# Patient Record
Sex: Female | Born: 1965 | Race: Black or African American | Hispanic: No | Smoking: Never smoker
Health system: Southern US, Community
[De-identification: ages and names within clinical notes are randomized; demographics above are authoritative.]

## PROBLEM LIST (undated history)

## (undated) DIAGNOSIS — D649 Anemia, unspecified: Secondary | ICD-10-CM

## (undated) HISTORY — DX: Anemia, unspecified: D64.9

## (undated) HISTORY — PX: TUBAL LIGATION: SHX77

---

## 2004-10-05 ENCOUNTER — Emergency Department: Payer: Self-pay | Admitting: Emergency Medicine

## 2011-12-24 ENCOUNTER — Other Ambulatory Visit: Payer: Self-pay | Admitting: Internal Medicine

## 2011-12-24 DIAGNOSIS — Z131 Encounter for screening for diabetes mellitus: Secondary | ICD-10-CM

## 2011-12-24 DIAGNOSIS — Z13220 Encounter for screening for lipoid disorders: Secondary | ICD-10-CM

## 2011-12-24 DIAGNOSIS — Z789 Other specified health status: Secondary | ICD-10-CM

## 2011-12-25 ENCOUNTER — Ambulatory Visit (INDEPENDENT_AMBULATORY_CARE_PROVIDER_SITE_OTHER): Payer: Self-pay | Admitting: Internal Medicine

## 2011-12-25 ENCOUNTER — Other Ambulatory Visit (INDEPENDENT_AMBULATORY_CARE_PROVIDER_SITE_OTHER): Payer: Self-pay

## 2011-12-25 VITALS — BP 108/68 | HR 60 | Temp 98.6°F | Resp 12 | Ht 64.5 in | Wt 134.0 lb

## 2011-12-25 DIAGNOSIS — Z124 Encounter for screening for malignant neoplasm of cervix: Secondary | ICD-10-CM

## 2011-12-25 DIAGNOSIS — Z0000 Encounter for general adult medical examination without abnormal findings: Secondary | ICD-10-CM

## 2011-12-25 DIAGNOSIS — Z1239 Encounter for other screening for malignant neoplasm of breast: Secondary | ICD-10-CM

## 2011-12-25 DIAGNOSIS — Z13220 Encounter for screening for lipoid disorders: Secondary | ICD-10-CM

## 2011-12-25 NOTE — Addendum Note (Signed)
Addended by: Mauri Reading on: 12/25/2011 08:34 AM   Modules accepted: Orders

## 2011-12-26 ENCOUNTER — Encounter: Payer: Self-pay | Admitting: Internal Medicine

## 2011-12-26 DIAGNOSIS — Z124 Encounter for screening for malignant neoplasm of cervix: Secondary | ICD-10-CM | POA: Insufficient documentation

## 2011-12-26 DIAGNOSIS — Z1239 Encounter for other screening for malignant neoplasm of breast: Secondary | ICD-10-CM | POA: Insufficient documentation

## 2011-12-26 DIAGNOSIS — Z0000 Encounter for general adult medical examination without abnormal findings: Secondary | ICD-10-CM | POA: Insufficient documentation

## 2011-12-26 NOTE — Assessment & Plan Note (Signed)
She will return for annual Pap smear.

## 2011-12-26 NOTE — Assessment & Plan Note (Signed)
She will return for her annual breast exam and mammogram order

## 2011-12-26 NOTE — Assessment & Plan Note (Signed)
Fasting lipids liver and kidney function and screen for nicotine abuse were all done today. Lipids and liver and kidney function were all normal. The nicotine metabolite  test still pending.

## 2011-12-26 NOTE — Progress Notes (Signed)
Patient ID: Alice Vaughn, female   DOB: November 30, 1965, 46 y.o.   MRN: [id number]  Subjective:     Alice Vaughn is a 46 y.o. female and is here for a comprehensive physical exam. The patient reports no problems.  History   Social History  . Marital Status: Married    Spouse Name: N/A    Number of Children: N/A  . Years of Education: N/A   Occupational History  . Not on file.   Social History Main Topics  . Smoking status: Never Smoker   . Smokeless tobacco: Not on file  . Alcohol Use: 0.5 oz/week    1 drink(s) per week  . Drug Use:   . Sexually Active: Yes    Birth Control/ Protection: Other-see comments   Other Topics Concern  . Not on file   Social History Narrative  . No narrative on file   Health Maintenance  Topic Date Due  . Pap Smear  09/29/1985  . Tetanus/tdap  09/30/1986  . Influenza Vaccine  10/26/2011    The following portions of the patient's history were reviewed and updated as appropriate: allergies, current medications, past family history, past medical history, past social history, past surgical history and problem list.  Review of Systems A comprehensive review of systems was negative.   Objective:    No exam performed today, rescheduled for next week.    Assessment:   Routine general medical examination at a health care facility Fasting lipids liver and kidney function and screen for nicotine abuse were all done today. Lipids and liver and kidney function were all normal. The nicotine metabolite  test still pending.    Screening for breast cancer She will return for her annual breast exam and mammogram order  Screening for cervical cancer She will return for annual Pap smear.    Updated Medication List No outpatient encounter prescriptions on file as of 12/25/2011.

## 2011-12-29 LAB — BASIC METABOLIC PANEL
BUN/Creatinine Ratio: 18 (ref 9–23)
Creatinine, Ser: 0.94 mg/dL (ref 0.57–1.00)
GFR calc Af Amer: 85 mL/min/{1.73_m2} (ref >59)
GFR calc non Af Amer: 74 mL/min/{1.73_m2} (ref >59)
Potassium: 4 mmol/L (ref 3.5–5.2)
Sodium: 135 mmol/L (ref 134–144)

## 2011-12-29 LAB — LIPID PANEL
Chol/HDL Ratio: 2.4 ratio units (ref 0.0–4.4)
HDL: 81 mg/dL (ref >39)
VLDL Cholesterol Cal: 8 mg/dL (ref 5–40)

## 2012-01-08 ENCOUNTER — Ambulatory Visit (INDEPENDENT_AMBULATORY_CARE_PROVIDER_SITE_OTHER): Payer: Self-pay | Admitting: Internal Medicine

## 2012-01-08 ENCOUNTER — Encounter: Payer: Self-pay | Admitting: Internal Medicine

## 2012-01-08 VITALS — BP 104/62 | HR 70 | Temp 97.8°F | Ht 64.0 in | Wt 134.5 lb

## 2012-01-08 DIAGNOSIS — Z0000 Encounter for general adult medical examination without abnormal findings: Secondary | ICD-10-CM

## 2012-01-08 DIAGNOSIS — Z124 Encounter for screening for malignant neoplasm of cervix: Secondary | ICD-10-CM

## 2012-01-08 DIAGNOSIS — D509 Iron deficiency anemia, unspecified: Secondary | ICD-10-CM

## 2012-01-08 DIAGNOSIS — N6310 Unspecified lump in the right breast, unspecified quadrant: Secondary | ICD-10-CM

## 2012-01-08 DIAGNOSIS — Z1239 Encounter for other screening for malignant neoplasm of breast: Secondary | ICD-10-CM

## 2012-01-08 DIAGNOSIS — N63 Unspecified lump in unspecified breast: Secondary | ICD-10-CM

## 2012-01-08 NOTE — Assessment & Plan Note (Signed)
Postponing since patient had intercourse this morning and has a history of ASCUS.  Will reschedule after the new year. Patient asked to abstain for 48 hours prior to PAP.

## 2012-01-08 NOTE — Assessment & Plan Note (Signed)
Chronic with prior iron replacement stopped due to constipation.  Will return for CBC in January with her PAP/pelvic.  Take home stool card given.

## 2012-01-08 NOTE — Progress Notes (Signed)
Patient ID: Alice Vaughn, female   DOB: 09-03-1965, 46 y.o.   MRN: [id number] Patient Active Problem List  Diagnosis  . Routine general medical examination at a health care facility  . Screening for breast cancer  . Screening for cervical cancer  . Anemia, iron deficiency    Subjective:  CC:   Chief Complaint  Patient presents with  . Annual Exam    HPI:   Alice Vaughn is a 46 y.o. female who presents as a new patient to establish primary care with the chief complaint of need for Annual exam.  No current complaints.  History of 4 pregnancies,  All vaginal deliveries.  The first 3 were 8 lbs 13 ounces last one 7 7.   No complications. Breast fed all but her last child who could not nurse. Every pregnancy had iron deficiency anemia diagnosed but etiology unclear.  No history of heavy menses until the last two months.  Usually has heavy bleeding for 3 days with taper to off.  Last 2 periods were slightly heavier with 6 days of bleeding.  No severe cramps.  History of abnormal PAP smear 2 years ago, has been abstinent for years until recently when she  Remarried several months ago.  She did have intercourse this morning so we are deferring PAP smear until January. No prior mammogram.     Past Medical History  Diagnosis Date  . Anemia     Past Surgical History  Procedure Date  . Tubal ligation     Family History  Problem Relation Age of Onset  . Hypertension Maternal Grandmother   . Diabetes Maternal Grandmother   . Alcohol abuse Maternal Grandfather   . COPD Maternal Grandfather     History   Social History  . Marital Status: Married    Spouse Name: tyrone Ship broker    Number of Children: 4  . 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 Smoking status: Never Smoker   . Smokeless tobacco: Not on file  . Alcohol Use: 0.6 oz/week    1 Glasses of wine per week  . Drug Use:   .  Sexually Active: Yes    Birth Control/ Protection: Other-see comments, Surgical   Other Topics Concern  . Not on file   Social History Narrative   Married to [employer].  4 children from previous marriage: 3 daughters,  one son         @ALLHX @    Review of Systems:   The remainder of the review of systems was negative except those addressed in the HPI.       Objective:  BP 104/62  Pulse 70  Temp 97.8 F (36.6 C) (Oral)  Ht 5\' 4"  (1.626 m)  Wt 134 lb 8 oz (61.009 kg)  BMI 23.09 kg/m2  SpO2 99%  LMP 12/27/2011  General appearance: alert, cooperative and appears stated age Ears: normal TM's and external ear canals both ears Throat: lips, mucosa, and tongue normal; teeth and gums normal Neck: no adenopathy, no carotid bruit, supple, symmetrical, trachea midline and thyroid not enlarged, symmetric, no tenderness/mass/nodules Back: symmetric, no curvature. ROM normal. No CVA tenderness. Lungs: clear to auscultation bilaterally Heart: regular rate and rhythm, S1, S2 normal, no murmur, click, rub or gallop Abdomen: soft, non-tender; bowel sounds normal; no masses,  no organomegaly Pulses: 2+ and symmetric Skin: Skin color, texture, turgor normal. No rashes  or lesions Lymph nodes: Cervical, supraclavicular, and axillary nodes normal.  Assessment and Plan:  Screening for cervical cancer Postponing since patient had intercourse this morning and has a history of ASCUS.  Will reschedule after the new year. Patient asked to abstain for 48 hours prior to PAP.   Screening for breast cancer Screening breast exam was done today.  Nodule noted on the right side so screening mammo was changed to diagnostic,  [facility].  Anemia, iron deficiency Chronic with prior iron replacement stopped due to constipation.  Will return for CBC in January with her PAP/pelvic.  Take home stool card given.   Routine general medical examination at a health care facility Physical exam  was completed except for pelvic since patient had intercourse this morning. Will return for her  PAP in January .  Screening labs were done last week due to insurance deadlines and LDL was 109, HDl 81 trigs 39.   Patient is not being charged for visit today because she was charged on Nov 1 for annual PE when she had her labs done.   Updated Medication List No outpatient encounter prescriptions on file as of 01/08/2012.     Orders Placed This Encounter  Procedures  . MM Digital Diagnostic Bilat  . POCT Hemoccult (HOME) Blood/Stoool Cards    No Follow-up on file.

## 2012-01-08 NOTE — Assessment & Plan Note (Signed)
Physical exam was completed except for pelvic since patient had intercourse this morning. Will return for her  PAP in January .  Screening labs were done last week due to insurance deadlines and LDL was 109, HDl 81 trigs 39.

## 2012-01-08 NOTE — Assessment & Plan Note (Signed)
Screening breast exam was done today.  Nodule noted on the right side so screening mammo was changed to diagnostic,  [facility].

## 2012-03-19 ENCOUNTER — Other Ambulatory Visit: Payer: Self-pay | Admitting: Internal Medicine

## 2012-03-19 DIAGNOSIS — Z1239 Encounter for other screening for malignant neoplasm of breast: Secondary | ICD-10-CM

## 2012-04-28 ENCOUNTER — Encounter: Payer: Self-pay | Admitting: Internal Medicine

## 2012-05-28 ENCOUNTER — Other Ambulatory Visit: Payer: Self-pay | Admitting: Internal Medicine

## 2012-05-28 DIAGNOSIS — K5909 Other constipation: Secondary | ICD-10-CM

## 2012-05-28 MED ORDER — LINACLOTIDE 145 MCG PO CAPS: 145.0000 ug | ORAL_CAPSULE | Freq: Every day | ORAL | Status: DC

## 2012-09-16 ENCOUNTER — Other Ambulatory Visit: Payer: Self-pay | Admitting: Internal Medicine

## 2012-09-16 MED ORDER — CHLORHEXIDINE GLUCONATE 0.12 % MT SOLN: 15.0000 mL | Freq: Two times a day (BID) | OROMUCOSAL | Status: DC

## 2012-10-30 ENCOUNTER — Other Ambulatory Visit: Payer: Self-pay | Admitting: Internal Medicine

## 2012-10-30 DIAGNOSIS — K0510 Chronic gingivitis, plaque induced: Secondary | ICD-10-CM | POA: Insufficient documentation

## 2012-10-30 DIAGNOSIS — K5909 Other constipation: Secondary | ICD-10-CM

## 2012-10-30 MED ORDER — LINACLOTIDE 145 MCG PO CAPS: 145.0000 ug | ORAL_CAPSULE | Freq: Every day | ORAL | Status: DC

## 2012-10-30 MED ORDER — CHLORHEXIDINE GLUCONATE 0.12 % MT SOLN: 15.0000 mL | Freq: Two times a day (BID) | OROMUCOSAL | Status: DC

## 2012-11-01 ENCOUNTER — Telehealth: Payer: Self-pay | Admitting: *Deleted

## 2012-11-01 MED ORDER — CHLORHEXIDINE GLUCONATE 0.12 % MT SOLN: 15.0000 mL | Freq: Two times a day (BID) | OROMUCOSAL | Status: DC

## 2012-11-01 NOTE — Telephone Encounter (Signed)
Please advise as to refill no OV since 11/13

## 2012-11-01 NOTE — Telephone Encounter (Signed)
Resent with clearer instructions.

## 2012-11-01 NOTE — Telephone Encounter (Signed)
Pharmacy Note:  Chlorhexidine (Peridex) 0.12 % solution   Please re-send with better directions

## 2012-11-07 ENCOUNTER — Other Ambulatory Visit: Payer: Self-pay | Admitting: Internal Medicine

## 2012-11-07 DIAGNOSIS — N390 Urinary tract infection, site not specified: Secondary | ICD-10-CM | POA: Insufficient documentation

## 2012-11-07 DIAGNOSIS — K5909 Other constipation: Secondary | ICD-10-CM

## 2012-11-07 MED ORDER — LUBIPROSTONE 24 MCG PO CAPS: 24.0000 ug | ORAL_CAPSULE | Freq: Two times a day (BID) | ORAL | Status: DC

## 2012-11-07 MED ORDER — SULFAMETHOXAZOLE-TRIMETHOPRIM 800-160 MG PO TABS: 1.0000 | ORAL_TABLET | Freq: Two times a day (BID) | ORAL | Status: DC

## 2012-11-07 NOTE — Assessment & Plan Note (Signed)
Improved with trials of amitiza and linzess.

## 2012-11-07 NOTE — Assessment & Plan Note (Signed)
Recurrent dysuria  And frequency reported.  Empiric septra DS x 3-5 days called in to [facility]  Culture if not resolved.

## 2012-12-04 ENCOUNTER — Other Ambulatory Visit: Payer: Self-pay | Admitting: Internal Medicine

## 2012-12-04 DIAGNOSIS — L03213 Periorbital cellulitis: Secondary | ICD-10-CM

## 2012-12-04 DIAGNOSIS — H109 Unspecified conjunctivitis: Secondary | ICD-10-CM | POA: Insufficient documentation

## 2012-12-04 MED ORDER — CLINDAMYCIN HCL 150 MG PO CAPS: 450.0000 mg | ORAL_CAPSULE | Freq: Three times a day (TID) | ORAL | Status: DC

## 2012-12-04 MED ORDER — ERYTHROMYCIN 5 MG/GM OP OINT: TOPICAL_OINTMENT | OPHTHALMIC | Status: DC

## 2012-12-04 MED ORDER — ERYTHROMYCIN 5 MG/GM OP OINT: TOPICAL_OINTMENT | Freq: Every day | OPHTHALMIC | Status: DC

## 2012-12-04 NOTE — Assessment & Plan Note (Signed)
likley viral, but has caused swelling and redness, and daughter had it first

## 2013-06-30 ENCOUNTER — Ambulatory Visit (INDEPENDENT_AMBULATORY_CARE_PROVIDER_SITE_OTHER): Payer: 59 | Admitting: Internal Medicine

## 2013-06-30 ENCOUNTER — Other Ambulatory Visit: Payer: 59

## 2013-06-30 ENCOUNTER — Encounter: Payer: Self-pay | Admitting: Internal Medicine

## 2013-06-30 ENCOUNTER — Encounter (INDEPENDENT_AMBULATORY_CARE_PROVIDER_SITE_OTHER): Payer: Self-pay

## 2013-06-30 ENCOUNTER — Other Ambulatory Visit (HOSPITAL_COMMUNITY)
Admission: RE | Admit: 2013-06-30 | Discharge: 2013-06-30 | Disposition: A | Discharge status: Home / Self Care | Payer: 59 | Source: Ambulatory Visit | Attending: Internal Medicine | Admitting: Internal Medicine

## 2013-06-30 VITALS — BP 102/64 | HR 81 | Temp 98.4°F | Resp 16 | Ht 65.0 in | Wt 131.5 lb

## 2013-06-30 DIAGNOSIS — N760 Acute vaginitis: Secondary | ICD-10-CM

## 2013-06-30 DIAGNOSIS — R5383 Other fatigue: Secondary | ICD-10-CM

## 2013-06-30 DIAGNOSIS — Z01419 Encounter for gynecological examination (general) (routine) without abnormal findings: Secondary | ICD-10-CM | POA: Insufficient documentation

## 2013-06-30 DIAGNOSIS — B9689 Other specified bacterial agents as the cause of diseases classified elsewhere: Secondary | ICD-10-CM

## 2013-06-30 DIAGNOSIS — R5381 Other malaise: Secondary | ICD-10-CM

## 2013-06-30 DIAGNOSIS — D509 Iron deficiency anemia, unspecified: Secondary | ICD-10-CM

## 2013-06-30 DIAGNOSIS — N926 Irregular menstruation, unspecified: Secondary | ICD-10-CM

## 2013-06-30 DIAGNOSIS — Z113 Encounter for screening for infections with a predominantly sexual mode of transmission: Secondary | ICD-10-CM

## 2013-06-30 DIAGNOSIS — D649 Anemia, unspecified: Secondary | ICD-10-CM

## 2013-06-30 DIAGNOSIS — K5909 Other constipation: Secondary | ICD-10-CM

## 2013-06-30 DIAGNOSIS — N390 Urinary tract infection, site not specified: Secondary | ICD-10-CM

## 2013-06-30 DIAGNOSIS — Z0000 Encounter for general adult medical examination without abnormal findings: Secondary | ICD-10-CM

## 2013-06-30 DIAGNOSIS — K5900 Constipation, unspecified: Secondary | ICD-10-CM

## 2013-06-30 DIAGNOSIS — Z124 Encounter for screening for malignant neoplasm of cervix: Secondary | ICD-10-CM

## 2013-06-30 DIAGNOSIS — Z1239 Encounter for other screening for malignant neoplasm of breast: Secondary | ICD-10-CM

## 2013-06-30 DIAGNOSIS — Z1151 Encounter for screening for human papillomavirus (HPV): Secondary | ICD-10-CM | POA: Insufficient documentation

## 2013-06-30 DIAGNOSIS — D62 Acute posthemorrhagic anemia: Secondary | ICD-10-CM

## 2013-06-30 DIAGNOSIS — A499 Bacterial infection, unspecified: Secondary | ICD-10-CM

## 2013-06-30 LAB — CBC WITH DIFFERENTIAL/PLATELET
BASOS PCT: 0.7 % (ref 0.0–3.0)
Basophils Absolute: 0.1 10*3/uL (ref 0.0–0.1)
EOS ABS: 0 10*3/uL (ref 0.0–0.7)
Eosinophils Relative: 0.4 % (ref 0.0–5.0)
HCT: 35.3 % — ABNORMAL LOW (ref 36.0–46.0)
HEMOGLOBIN: 11.7 g/dL — AB (ref 12.0–15.0)
LYMPHS PCT: 22.2 % (ref 12.0–46.0)
Lymphs Abs: 1.5 10*3/uL (ref 0.7–4.0)
MCHC: 33.1 g/dL (ref 30.0–36.0)
MCV: 88.6 fl (ref 78.0–100.0)
Monocytes Absolute: 0.5 10*3/uL (ref 0.1–1.0)
Monocytes Relative: 7.4 % (ref 3.0–12.0)
NEUTROS ABS: 4.7 10*3/uL (ref 1.4–7.7)
Neutrophils Relative %: 69.3 % (ref 43.0–77.0)
Platelets: 215 10*3/uL (ref 150.0–400.0)
RBC: 3.98 Mil/uL (ref 3.87–5.11)
RDW: 14.5 % (ref 11.5–15.5)
WBC: 6.8 10*3/uL (ref 4.0–10.5)

## 2013-06-30 LAB — COMPREHENSIVE METABOLIC PANEL
ALK PHOS: 33 U/L — AB (ref 39–117)
ALT: 18 U/L (ref 0–35)
AST: 28 U/L (ref 0–37)
Albumin: 3.8 g/dL (ref 3.5–5.2)
BILIRUBIN TOTAL: 0.8 mg/dL (ref 0.2–1.2)
BUN: 24 mg/dL — AB (ref 6–23)
CO2: 30 mEq/L (ref 19–32)
CREATININE: 1 mg/dL (ref 0.4–1.2)
Calcium: 9.3 mg/dL (ref 8.4–10.5)
Chloride: 101 mEq/L (ref 96–112)
GFR: 80.56 mL/min (ref >60.00)
GLUCOSE: 75 mg/dL (ref 70–99)
Potassium: 3.9 mEq/L (ref 3.5–5.1)
Sodium: 136 mEq/L (ref 135–145)
Total Protein: 7.1 g/dL (ref 6.0–8.3)

## 2013-06-30 LAB — FOLLICLE STIMULATING HORMONE: FSH: 3.9 m[IU]/mL

## 2013-06-30 LAB — LUTEINIZING HORMONE: LH: 4.34 m[IU]/mL

## 2013-06-30 LAB — FERRITIN: FERRITIN: 13.4 ng/mL (ref 10.0–291.0)

## 2013-06-30 LAB — TSH: TSH: 0.73 u[IU]/mL (ref 0.35–4.50)

## 2013-06-30 NOTE — Progress Notes (Signed)
Pre-visit discussion using our clinic review tool. No additional management support is needed unless otherwise documented below in the visit note.  

## 2013-06-30 NOTE — Patient Instructions (Signed)
You had your annual  wellness exam today.    We will [facility]  [facility] on [address] since you need a 3D  .     We will contact you with the bloodwork results

## 2013-07-01 DIAGNOSIS — N760 Acute vaginitis: Secondary | ICD-10-CM

## 2013-07-01 DIAGNOSIS — B9689 Other specified bacterial agents as the cause of diseases classified elsewhere: Secondary | ICD-10-CM | POA: Insufficient documentation

## 2013-07-01 LAB — IRON AND TIBC
%SAT: 28 % (ref 20–55)
IRON: 88 ug/dL (ref 42–145)
TIBC: 316 ug/dL (ref 250–470)
UIBC: 228 ug/dL (ref 125–400)

## 2013-07-01 LAB — WET PREP BY MOLECULAR PROBE
Candida species: NEGATIVE
Gardnerella vaginalis: POSITIVE — AB
Trichomonas vaginosis: NEGATIVE

## 2013-07-01 LAB — HEPATITIS C ANTIBODY: HCV Ab: NEGATIVE

## 2013-07-01 LAB — HIV ANTIBODY (ROUTINE TESTING W REFLEX): HIV 1&2 Ab, 4th Generation: NONREACTIVE

## 2013-07-01 MED ORDER — METRONIDAZOLE 500 MG PO TABS: 500.0000 mg | ORAL_TABLET | Freq: Two times a day (BID) | ORAL | Status: DC

## 2013-07-01 NOTE — Assessment & Plan Note (Signed)
- 

## 2013-07-03 NOTE — Assessment & Plan Note (Signed)
Improved with Linzess 290 mcg taken every other day

## 2013-07-03 NOTE — Assessment & Plan Note (Signed)
Annual exam including breast , pelvic and PAP smear were  done today.  

## 2013-07-03 NOTE — Progress Notes (Signed)
Patient ID: Zamyra Risk, female   DOB: Jul 14, 1965, 48 y.o.   MRN: [id number]  Subjective:     Alice Vaughn is a 48 y.o. female and is here for a comprehensive physical exam. The patient reports hot flashes and loss of libido.  History   Social History  . Marital Status: Married    Spouse Name: tyrone Ship broker    Number of Children: 4  . 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 Smoking status: Never Smoker   . Smokeless tobacco: Not on file  . Alcohol Use: 0.6 oz/week    1 Glasses of wine per week  . Drug Use: Not on file  . Sexual Activity: Yes    Birth Control/ Protection: Other-see comments, Surgical   Other Topics Concern  . Not on file   Social History Narrative   Married to [employer].  4 children from previous marriage: 3 daughters,  one son   Health Maintenance  Topic Date Due  . Influenza Vaccine  09/24/2013  . Pap Smear  06/30/2016  . Tetanus/tdap  07/01/2018    The following portions of the patient's history were reviewed and updated as appropriate: allergies, current medications, past family history, past medical history, past social history, past surgical history and problem list.  Review of Systems A comprehensive review of systems was negative.   Objective:   BP 102/64  Pulse 81  Temp(Src) 98.4 F (36.9 C) (Oral)  Resp 16  Ht 5\' 5"  (1.651 m)  Wt 131 lb 8 oz (59.648 kg)  BMI 21.88 kg/m2  SpO2 98% General Appearance:    Alert, cooperative, no distress, appears stated age  Head:    Normocephalic, without obvious abnormality, atraumatic  Eyes:    PERRL, conjunctiva/corneas clear, EOM's intact, fundi    benign, both eyes  Ears:    Normal TM's and external ear canals, both ears  Nose:   Nares normal, septum midline, mucosa normal, no drainage    or sinus tenderness  Throat:   Lips, mucosa, and tongue normal; teeth and gums normal  Neck:   Supple,  symmetrical, trachea midline, no adenopathy;    thyroid:  no enlargement/tenderness/nodules; no carotid   bruit or JVD  Back:     Symmetric, no curvature, ROM normal, no CVA tenderness  Lungs:     Clear to auscultation bilaterally, respirations unlabored  Chest Wall:    No tenderness or deformity   Heart:    Regular rate and rhythm, S1 and S2 normal, no murmur, rub   or gallop  Breast Exam:    No tenderness, masses, or nipple abnormality  Abdomen:     Soft, non-tender, bowel sounds active all four quadrants,    no masses, no organomegaly  Genitalia:    Pelvic: cervix normal in appearance, external genitalia normal, no adnexal masses or tenderness, no cervical motion tenderness, rectovaginal septum normal, uterus normal size, shape, and consistency and vagina normal with increased malodorous discharge  Extremities:   Extremities normal, atraumatic, no cyanosis or edema  Pulses:   2+ and symmetric all extremities  Skin:   Skin color, texture, turgor normal, no rashes or lesions  Lymph nodes:   Cervical, supraclavicular, and axillary nodes normal  Neurologic:   CNII-XII intact, normal strength, sensation and reflexes    throughout     Assessment and Plan:   Bacterial vaginosis Metronidazole 500 mg bid x  7 days   Chronic constipation Improved with Linzess 290 mcg taken every other day  UTI (urinary tract infection) flagyl 500 mg bid x 7 days.   Routine general medical examination at a health care facility Annual exam including breast , pelvic and PAP smear were done today.   Anemia, iron deficiency eesolved by current labs  Lab Results  Component Value Date   HGB 11.7* 06/30/2013   Lab Results  Component Value Date   FERRITIN 13.4 06/30/2013   Lab Results  Component Value Date   IRON 88 06/30/2013   TIBC 316 06/30/2013   FERRITIN 13.4 06/30/2013    Updated Medication List Outpatient Encounter Prescriptions as of 06/30/2013  Medication Sig  . Linaclotide (LINZESS) 290 MCG CAPS  capsule Take 290 mcg by mouth daily.  . metroNIDAZOLE (FLAGYL) 500 MG tablet Take 1 tablet (500 mg total) by mouth 2 (two) times daily.  . [DISCONTINUED] chlorhexidine (PERIDEX) 0.12 % solution Use as directed 15 mLs in the mouth or throat 2 (two) times daily. Swish 15 ml in mouth for 30 seconds two times daily for one week  . [DISCONTINUED] clindamycin (CLEOCIN) 150 MG capsule Take 3 capsules (450 mg total) by mouth 3 (three) times daily.  . [DISCONTINUED] erythromycin ophthalmic ointment 1/2 inch applied to affected eye four times daily for 7 days  . [DISCONTINUED] lubiprostone (AMITIZA) 24 MCG capsule Take 1 capsule (24 mcg total) by mouth 2 (two) times daily with a meal.  . [DISCONTINUED] sulfamethoxazole-trimethoprim (SEPTRA DS) 800-160 MG per tablet Take 1 tablet by mouth 2 (two) times daily.

## 2013-07-03 NOTE — Assessment & Plan Note (Signed)
eesolved by current labs  Lab Results  Component Value Date   HGB 11.7* 06/30/2013   Lab Results  Component Value Date   FERRITIN 13.4 06/30/2013   Lab Results  Component Value Date   IRON 88 06/30/2013   TIBC 316 06/30/2013   FERRITIN 13.4 06/30/2013

## 2013-07-03 NOTE — Assessment & Plan Note (Signed)
flagyl 500 mg bid x 7 days.

## 2013-07-04 LAB — GC/CHLAMYDIA PROBE AMP
CT Probe RNA: NEGATIVE
GC Probe RNA: NEGATIVE

## 2013-07-06 ENCOUNTER — Encounter: Payer: Self-pay | Admitting: *Deleted

## 2013-07-06 ENCOUNTER — Other Ambulatory Visit: Payer: Self-pay | Admitting: Internal Medicine

## 2013-07-06 DIAGNOSIS — N6489 Other specified disorders of breast: Secondary | ICD-10-CM

## 2013-07-21 ENCOUNTER — Other Ambulatory Visit: Payer: Self-pay | Admitting: Internal Medicine

## 2013-07-21 DIAGNOSIS — J209 Acute bronchitis, unspecified: Secondary | ICD-10-CM

## 2013-07-21 MED ORDER — GUAIFENESIN-CODEINE 100-10 MG/5ML PO SYRP: 5.0000 mL | ORAL_SOLUTION | Freq: Three times a day (TID) | ORAL | Status: DC | PRN

## 2013-07-21 MED ORDER — AZITHROMYCIN 250 MG PO TABS: ORAL_TABLET | ORAL | Status: DC

## 2013-07-21 MED ORDER — PREDNISONE (PAK) 10 MG PO TABS: ORAL_TABLET | ORAL | Status: DC

## 2013-07-26 ENCOUNTER — Other Ambulatory Visit: Payer: Self-pay | Admitting: Internal Medicine

## 2013-07-26 ENCOUNTER — Other Ambulatory Visit: Payer: Self-pay

## 2013-07-26 DIAGNOSIS — N6489 Other specified disorders of breast: Secondary | ICD-10-CM

## 2013-07-27 ENCOUNTER — Ambulatory Visit
Admission: RE | Admit: 2013-07-27 | Discharge: 2013-07-27 | Disposition: A | Discharge status: Home / Self Care | Payer: 59 | Source: Ambulatory Visit | Attending: Internal Medicine | Admitting: Internal Medicine

## 2013-07-27 DIAGNOSIS — N6489 Other specified disorders of breast: Secondary | ICD-10-CM

## 2014-03-09 ENCOUNTER — Telehealth: Payer: Self-pay | Admitting: Internal Medicine

## 2014-11-07 ENCOUNTER — Other Ambulatory Visit: Payer: Self-pay | Admitting: Internal Medicine

## 2014-11-07 DIAGNOSIS — M25569 Pain in unspecified knee: Secondary | ICD-10-CM | POA: Insufficient documentation

## 2014-11-07 DIAGNOSIS — M25562 Pain in left knee: Secondary | ICD-10-CM

## 2014-11-07 MED ORDER — MELOXICAM 15 MG PO TABS: 15.0000 mg | ORAL_TABLET | Freq: Every day | ORAL | Status: DC

## 2014-11-11 ENCOUNTER — Other Ambulatory Visit: Payer: Self-pay | Admitting: Internal Medicine

## 2014-11-11 MED ORDER — CYCLOBENZAPRINE HCL 10 MG PO TABS: 10.0000 mg | ORAL_TABLET | Freq: Three times a day (TID) | ORAL | Status: DC | PRN

## 2014-11-13 ENCOUNTER — Other Ambulatory Visit: Payer: Self-pay | Admitting: Internal Medicine

## 2014-11-13 MED ORDER — CYCLOBENZAPRINE HCL 10 MG PO TABS: 10.0000 mg | ORAL_TABLET | Freq: Three times a day (TID) | ORAL | Status: DC | PRN

## 2014-11-22 ENCOUNTER — Ambulatory Visit (INDEPENDENT_AMBULATORY_CARE_PROVIDER_SITE_OTHER): Payer: 59 | Admitting: Family Medicine

## 2014-11-22 ENCOUNTER — Other Ambulatory Visit (INDEPENDENT_AMBULATORY_CARE_PROVIDER_SITE_OTHER): Payer: 59

## 2014-11-22 ENCOUNTER — Encounter: Payer: Self-pay | Admitting: Family Medicine

## 2014-11-22 VITALS — BP 122/78 | HR 75 | Ht 63.0 in | Wt 133.0 lb

## 2014-11-22 DIAGNOSIS — M25562 Pain in left knee: Secondary | ICD-10-CM

## 2014-11-22 DIAGNOSIS — M84362A Stress fracture, left tibia, initial encounter for fracture: Secondary | ICD-10-CM | POA: Insufficient documentation

## 2014-11-22 MED ORDER — VITAMIN D (ERGOCALCIFEROL) 1.25 MG (50000 UNIT) PO CAPS: 50000.0000 [IU] | ORAL_CAPSULE | ORAL | Status: DC

## 2014-11-22 NOTE — Assessment & Plan Note (Signed)
I do believe the patient has more of a stress reaction. Patient will avoid any high impact exercises at this time. We discussed compression, high-dose vitamin D supplementation, icing protocol and proper shoewear. Patient come back again in 3 weeks for further evaluation. At that time we will ultrasound the area again to make sure callus formation is noted. Continuing have pain x-rays and possibly further imaging will be necessary.

## 2014-11-22 NOTE — Progress Notes (Signed)
Pre visit review using our clinic review tool, if applicable. No additional management support is needed unless otherwise documented below in the visit note. 

## 2014-11-22 NOTE — Progress Notes (Signed)
[facility] [address] [place], [place] [address] Phone: [phone] Subjective:    I'm seeing this patient by the request  of:  TULLO, TERESA L, MD   CC: left knee pain  [id number] Alice Vaughn is a 49 y.o. female coming in with complaint of left knee pain. Patient has had this pain for multiple months. Patient had injury for more of a patellofemoral syndrome as well as an distal iliotibial band. Has not notice any significant improvement and seems to be worsening. Seems to be fairly localized on the anterior lateral aspect of the knee she states. Sometimes can radiate up her leg as well as down her leg. Denies any weakness. 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 Patient states it can be very uncomfortable at night as well. Denies any fevers, chills, or any abnormal weight loss. Rates the severity of pain a 6 out of 10 and worsening.  Past Medical History  Diagnosis Date  . Anemia    Past Surgical History  Procedure Laterality Date  . Tubal ligation     Social History  Substance Use Topics  . Smoking status: Never Smoker   . Smokeless tobacco: None  . Alcohol Use: 0.6 oz/week    1 Glasses of wine per week   No Known Allergies Family History  Problem Relation Age of Onset  . Hypertension Maternal Grandmother   . Diabetes Maternal Grandmother   . Alcohol abuse Maternal Grandfather   . COPD Maternal Grandfather         Past medical history, social, surgical and family history all reviewed in electronic medical record.   Review of Systems: No headache, visual changes, nausea, vomiting, diarrhea, constipation, dizziness, abdominal pain, skin rash, fevers, chills, night sweats, weight loss, swollen lymph nodes, body aches, joint swelling, muscle aches, chest pain, shortness of breath, mood changes.   Objective Blood pressure 122/78, pulse 75, height  (1.6 m), weight 133 lb (60.328  kg), SpO2 98 %.  General: No apparent distress alert and oriented x3 mood and affect normal, dressed appropriately.  HEENT: Pupils equal, extraocular movements intact  Respiratory: Patient's speak in full sentences and does not appear short of breath  Cardiovascular: No lower extremity edema, non tender, no erythema  Skin: Warm dry intact with no signs of infection or rash on extremities or on axial skeleton.  Abdomen: Soft nontender  Neuro: Cranial nerves II through XII are intact, neurovascularly intact in all extremities with 2+ DTRs and 2+ pulses.  Lymph: No lymphadenopathy of posterior or anterior cervical chain or axillae bilaterally.  Gait normal with good balance and coordination.  MSK:  Non tender with full range of motion and good stability and symmetric strength and tone of shoulders, elbows, wrist, hip and ankles bilaterally.  Beighton score 7 Knee:Left  Normal to inspection with no erythema or effusion or obvious bony abnormalities. Tender over the medial joint line as well as over the lateral aspect of the patella just inferior to the anterior lateral joint line. ROM full in flexion and extension and lower leg rotation.patient actually has some mild benign hypermobility syndrome Ligaments with solid consistent endpoints including ACL, PCL, LCL, MCL. Negative Mcmurray's, Apley's, and Thessalonian tests. Non painful patellar compression. Patellar glide without crepitus. Patellar and quadriceps tendons unremarkable. Hamstring and quadriceps strength is normal.  Patient is unable to jump on leg greater than 5 times without severe pain. Contralateral knee unremarkable  Foot exam shows the patient does have severe over pronation of the feet bilaterally.  MSK US performed of: Left This study was ordered, performed, and interpreted by Terrilee Files D.O.  Knee: All structures visualized. Anteromedial, anterolateral, posteromedial, and posterolateral menisci unremarkable without  tearing, fraying, effusion, or displacement. Patient though over the lateral aspect of the tibia does have an area of bone thickening and hypoechoic changes. Increasing Doppler flow in the area as well. No masses appreciated. Patellar Tendon unremarkable on long and transverse views without effusion. No abnormality of prepatellar bursa. LCL and MCL unremarkable on long and transverse views. No abnormality of origin of medial or lateral head of the gastrocnemius.  IMPRESSION:  Tibial stress fracture lateral side     Impression and Recommendations:     This case required medical decision making of moderate complexity.

## 2014-11-22 NOTE — Patient Instructions (Addendum)
Good to see you.  Ice 20 minutes 2 times daily. Usually after activity and before bed. Exercises 3 times a week.  Wear compression sleeve New balance greater than 700.(cross training) Once weekly vitamin D Low impact exercises are better See me again in 3 weeks.

## 2014-11-23 ENCOUNTER — Other Ambulatory Visit: Payer: Self-pay | Admitting: Internal Medicine

## 2014-11-23 DIAGNOSIS — E559 Vitamin D deficiency, unspecified: Secondary | ICD-10-CM

## 2014-11-24 NOTE — Progress Notes (Signed)
Call and schedule lab for Vitamin D check.

## 2014-11-27 ENCOUNTER — Other Ambulatory Visit (INDEPENDENT_AMBULATORY_CARE_PROVIDER_SITE_OTHER): Payer: 59

## 2014-11-27 DIAGNOSIS — E559 Vitamin D deficiency, unspecified: Secondary | ICD-10-CM | POA: Diagnosis not present

## 2014-11-27 LAB — VITAMIN D 25 HYDROXY (VIT D DEFICIENCY, FRACTURES): VITD: 15.13 ng/mL — ABNORMAL LOW (ref 30.00–100.00)

## 2014-11-28 DIAGNOSIS — E559 Vitamin D deficiency, unspecified: Secondary | ICD-10-CM | POA: Insufficient documentation

## 2014-12-13 ENCOUNTER — Ambulatory Visit (INDEPENDENT_AMBULATORY_CARE_PROVIDER_SITE_OTHER): Payer: 59 | Admitting: Family Medicine

## 2014-12-13 ENCOUNTER — Encounter: Payer: Self-pay | Admitting: Family Medicine

## 2014-12-13 ENCOUNTER — Ambulatory Visit (INDEPENDENT_AMBULATORY_CARE_PROVIDER_SITE_OTHER)
Admission: RE | Admit: 2014-12-13 | Discharge: 2014-12-13 | Disposition: A | Discharge status: Home / Self Care | Payer: 59 | Source: Ambulatory Visit | Attending: Family Medicine | Admitting: Family Medicine

## 2014-12-13 VITALS — BP 120/74 | HR 83 | Ht 63.0 in | Wt 132.0 lb

## 2014-12-13 DIAGNOSIS — M84362G Stress fracture, left tibia, subsequent encounter for fracture with delayed healing: Secondary | ICD-10-CM | POA: Diagnosis not present

## 2014-12-13 DIAGNOSIS — M25562 Pain in left knee: Secondary | ICD-10-CM

## 2014-12-13 NOTE — Patient Instructions (Addendum)
Good to see you Ice is your friend Continue with the vitamin D  Continue the compression sleeve and do icing after activity Ok to be more active at this time and can do some high impact exercises 1-2 times a week.  If no better in 2 weeks we will do injection.

## 2014-12-13 NOTE — Progress Notes (Signed)
[facility] [address] [place], [place] [address] Phone: [phone] Subjective:    I'm seeing this patient by the request  of:  TULLO, TERESA L, MD   CC: left knee pain  [id number] Alice Vaughn is a 49 y.o. female coming in with complaint of left knee pain. Patient was seen previously and was diagnosed with more of a lateral tibial stress fracture. Patient states that she has days where it feels better in the 80s when it feels worse. Does notice some swelling in the area. Continues on the high-dose vitamin D supplementation. Patient denies any new symptoms. Patient has been avoiding certain activities. Denies any new symptoms. Denies any giving out on her at this time.  Past Medical History  Diagnosis Date  . Anemia    Past Surgical History  Procedure Laterality Date  . Tubal ligation     Social History  Substance Use Topics  . Smoking status: Never Smoker   . Smokeless tobacco: None  . Alcohol Use: 0.6 oz/week    1 Glasses of wine per week   No Known Allergies Family History  Problem Relation Age of Onset  . Hypertension Maternal Grandmother   . Diabetes Maternal Grandmother   . Alcohol abuse Maternal Grandfather   . COPD Maternal Grandfather         Past medical history, social, surgical and family history all reviewed in electronic medical record.   Review of Systems: No headache, visual changes, nausea, vomiting, diarrhea, constipation, dizziness, abdominal pain, skin rash, fevers, chills, night sweats, weight loss, swollen lymph nodes, body aches, joint swelling, muscle aches, chest pain, shortness of breath, mood changes.   Objective Blood pressure 120/74, pulse 83, height  (1.6 m), weight 132 lb (59.875 kg), SpO2 99 %.  General: No apparent distress alert and oriented x3 mood and affect normal, dressed appropriately.  HEENT: Pupils equal, extraocular movements intact  Respiratory: Patient's speak in full  sentences and does not appear short of breath  Cardiovascular: No lower extremity edema, non tender, no erythema  Skin: Warm dry intact with no signs of infection or rash on extremities or on axial skeleton.  Abdomen: Soft nontender  Neuro: Cranial nerves II through XII are intact, neurovascularly intact in all extremities with 2+ DTRs and 2+ pulses.  Lymph: No lymphadenopathy of posterior or anterior cervical chain or axillae bilaterally.  Gait normal with good balance and coordination.  MSK:  Non tender with full range of motion and good stability and symmetric strength and tone of shoulders, elbows, wrist, hip and ankles bilaterally.  Beighton score 7 Knee:Left  Normal to inspection with no erythema or effusion or obvious bony abnormalities. Continued tenderness over the anterior lateral joint line. ROM full in flexion and extension and lower leg rotation.patient actually has some mild benign hypermobility syndrome Ligaments with solid consistent endpoints including ACL, PCL, LCL, MCL. Negative Mcmurray's, Apley's, and Thessalonian tests. Non painful patellar compression. Patellar glide without crepitus. Patellar and quadriceps tendons unremarkable. Hamstring and quadriceps strength is normal.  Patient is unable to jump on leg greater than 5 times without severe pain. Contralateral knee unremarkable  Foot exam shows the patient does have severe over pronation of the feet bilaterally.  MSK US performed of: Left This study was ordered, performed, and interpreted by Terrilee Files D.O.  Knee: All structures visualized. Anterior lateral meniscus has what appears to be a possibility of a complex cyst Proximal aspect of the tibia on the  lateral aspect still has callus formation of the bone questionable stress fracture healing. Decrease in hypoechoic changes. Patellar Tendon unremarkable on long and transverse views without effusion. No abnormality of prepatellar bursa. LCL and MCL  unremarkable on long and transverse views. No abnormality of origin of medial or lateral head of the gastrocnemius.  IMPRESSION:  Continue irregularity of the tibial bone laterally     Impression and Recommendations:     This case required medical decision making of moderate complexity.

## 2014-12-13 NOTE — Progress Notes (Signed)
Pre visit review using our clinic review tool, if applicable. No additional management support is needed unless otherwise documented below in the visit note. 

## 2014-12-13 NOTE — Assessment & Plan Note (Signed)
Patient continues to have pain in this area. We will get x-rays to rule out any tibial plateau that could be contribute in. I do think that this is more of a complex cyst that is starting to form of this meniscus. We discussed the possibility of injection which patient declined. Patient is going to slowly try to progress over the course of the next 2 weeks with increasing activity. Patient has any worsening symptoms she will come back in 2 weeks and we'll consider injection. Patient has any instability of the knee we may need to consider advanced imaging.

## 2014-12-27 ENCOUNTER — Ambulatory Visit (INDEPENDENT_AMBULATORY_CARE_PROVIDER_SITE_OTHER): Payer: 59 | Admitting: Family Medicine

## 2014-12-27 ENCOUNTER — Encounter: Payer: Self-pay | Admitting: Family Medicine

## 2014-12-27 VITALS — BP 110/62 | HR 72 | Ht 63.0 in | Wt 134.0 lb

## 2014-12-27 DIAGNOSIS — M84362G Stress fracture, left tibia, subsequent encounter for fracture with delayed healing: Secondary | ICD-10-CM

## 2014-12-27 NOTE — Progress Notes (Signed)
Pre visit review using our clinic review tool, if applicable. No additional management support is needed unless otherwise documented below in the visit note. 

## 2014-12-27 NOTE — Progress Notes (Signed)
Alice Vaughn D.O. [facility] [address] [place], [place] [address] Phone: [phone] Subjective:    I'm seeing this patient by the request  of:  TULLO, TERESA L, MD   CC: left knee pain follow-up  OZH:[id number]:Subjective Alice Vaughn is a 49 y.o. female coming in with complaint of left knee pain. Patient was seen previously and was diagnosed with more of a lateral tibial stress fracture. Patient was not make any significant improvements we attempted to increase her vitamin D, icing protocol, and is discussed increase her activity significantly. Patient did have x-rays after last exam which were reviewed by me showing very little degenerative change of the knee itself with well-preserved spacing. Patient states she is 95% better. States that only some very minimal discomfort when she is does some mild running. Has started to increase her activity. 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 No new symptoms.  Past Medical History  Diagnosis Date  . Anemia    Past Surgical History  Procedure Laterality Date  . Tubal ligation     Social History  Substance Use Topics  . Smoking status: Never Smoker   . Smokeless tobacco: Not on file  . Alcohol Use: 0.6 oz/week    1 Glasses of wine per week   No Known Allergies Family History  Problem Relation Age of Onset  . Hypertension Maternal Grandmother   . Diabetes Maternal Grandmother   . Alcohol abuse Maternal Grandfather   . COPD Maternal Grandfather         Past medical history, social, surgical and family history all reviewed in electronic medical record.   Review of Systems: No headache, visual changes, nausea, vomiting, diarrhea, constipation, dizziness, abdominal pain, skin rash, fevers, chills, night sweats, weight loss, swollen lymph nodes, body aches, joint swelling, muscle aches, chest pain, shortness of breath, mood changes.   Objective Last menstrual period  11/24/2014.  General: No apparent distress alert and oriented x3 mood and affect normal, dressed appropriately.  HEENT: Pupils equal, extraocular movements intact  Respiratory: Patient's speak in full sentences and does not appear short of breath  Cardiovascular: No lower extremity edema, non tender, no erythema  Skin: Warm dry intact with no signs of infection or rash on extremities or on axial skeleton.  Abdomen: Soft nontender  Neuro: Cranial nerves II through XII are intact, neurovascularly intact in all extremities with 2+ DTRs and 2+ pulses.  Lymph: No lymphadenopathy of posterior or anterior cervical chain or axillae bilaterally.  Gait normal with good balance and coordination.  MSK:  Non tender with full range of motion and good stability and symmetric strength and tone of shoulders, elbows, wrist, hip and ankles bilaterally.  Beighton score 7 Knee:Left  Normal to inspection with no erythema or effusion or obvious bony abnormalities. Continued tenderness over the anterior lateral joint line. ROM full in flexion and extension and lower leg rotation.patient actually has some mild benign hypermobility syndrome Ligaments with solid consistent endpoints including ACL, PCL, LCL, MCL. Negative Mcmurray's, Apley's, and Thessalonian tests. Non painful patellar compression. Patellar glide without crepitus. Patellar and quadriceps tendons unremarkable. Hamstring and quadriceps strength is normal.  Patient is unable to jump on leg greater than 5 times without severe pain. Contralateral knee unremarkable  Foot exam shows the patient does have severe over pronation of the feet bilaterally.  MSK US performed of: Left This study was ordered, performed, and interpreted by Terrilee FilesZach Vaughn D.O.  Knee: All structures visualized. Anterior lateral meniscus no cystic feature noted Proximal aspect of the tibia on the lateral aspect irregularity does have callus formation likely confirming the earlier  stress fracture healing. Decrease in hypoechoic changes. Patellar Tendon unremarkable on long and transverse views without effusion. No abnormality of prepatellar bursa. LCL and MCL unremarkable on long and transverse views. No abnormality of origin of medial or lateral head of the gastrocnemius.  IMPRESSION:  Irregularity does show some callus formation     Impression and Recommendations:     This case required medical decision making of moderate complexity.

## 2014-12-27 NOTE — Addendum Note (Signed)
Addended by: Edwena FeltyARSON, LINDSAY T on: 12/27/2014 01:04 PM   Modules accepted: Medications

## 2014-12-27 NOTE — Patient Instructions (Signed)
Good to see you Ice when you need it.  Continue the vitamin D  Start increasing activity 10-20% a week  If pain comes back call me and we will get MRI Otherwise see me when you need me.

## 2014-12-27 NOTE — Assessment & Plan Note (Signed)
Patient doing very well with conservative therapy at this time. We discussed with patient about the possible cystic changes noted on the lateral meniscus and if this returns a could cause some of the discomfort. We discussed that advance imaging would be warranted if any buckling happens when she starts increasing her activity. His lungs patient does well she will follow-up with me again in 6-8 weeks for further evaluation and treatment.

## 2015-02-23 ENCOUNTER — Other Ambulatory Visit: Payer: Self-pay | Admitting: Internal Medicine

## 2015-02-23 DIAGNOSIS — L251 Unspecified contact dermatitis due to drugs in contact with skin: Secondary | ICD-10-CM

## 2015-02-23 MED ORDER — TRIAMCINOLONE ACETONIDE 0.1 % EX CREA: TOPICAL_CREAM | CUTANEOUS | Status: DC

## 2015-02-23 MED ORDER — PREDNISONE 10 MG PO TABS: ORAL_TABLET | ORAL | Status: DC

## 2015-03-28 ENCOUNTER — Other Ambulatory Visit: Payer: Self-pay | Admitting: Internal Medicine

## 2015-03-28 MED ORDER — LINACLOTIDE 290 MCG PO CAPS: 290.0000 ug | ORAL_CAPSULE | Freq: Every day | ORAL | Status: DC

## 2015-03-29 ENCOUNTER — Telehealth: Payer: Self-pay

## 2015-03-29 NOTE — Telephone Encounter (Signed)
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 Approved PA case Number- [id number]

## 2015-09-18 ENCOUNTER — Other Ambulatory Visit: Payer: Self-pay | Admitting: Internal Medicine

## 2015-09-18 MED ORDER — FUROSEMIDE 20 MG PO TABS: 20.0000 mg | ORAL_TABLET | Freq: Every day | ORAL | 0 refills | Status: DC

## 2015-10-19 ENCOUNTER — Other Ambulatory Visit: Payer: Self-pay | Admitting: Internal Medicine

## 2015-10-31 IMAGING — DX DG KNEE COMPLETE 4+V*L*
4 series · 4 of 4 positions shown · non-contrast
Comparison: None.

CLINICAL DATA: Inflammation of the knee, intermittent pain and
swelling over 6 weeks, no injury

EXAM:
LEFT KNEE - COMPLETE 4+ VIEW

[knee ap]
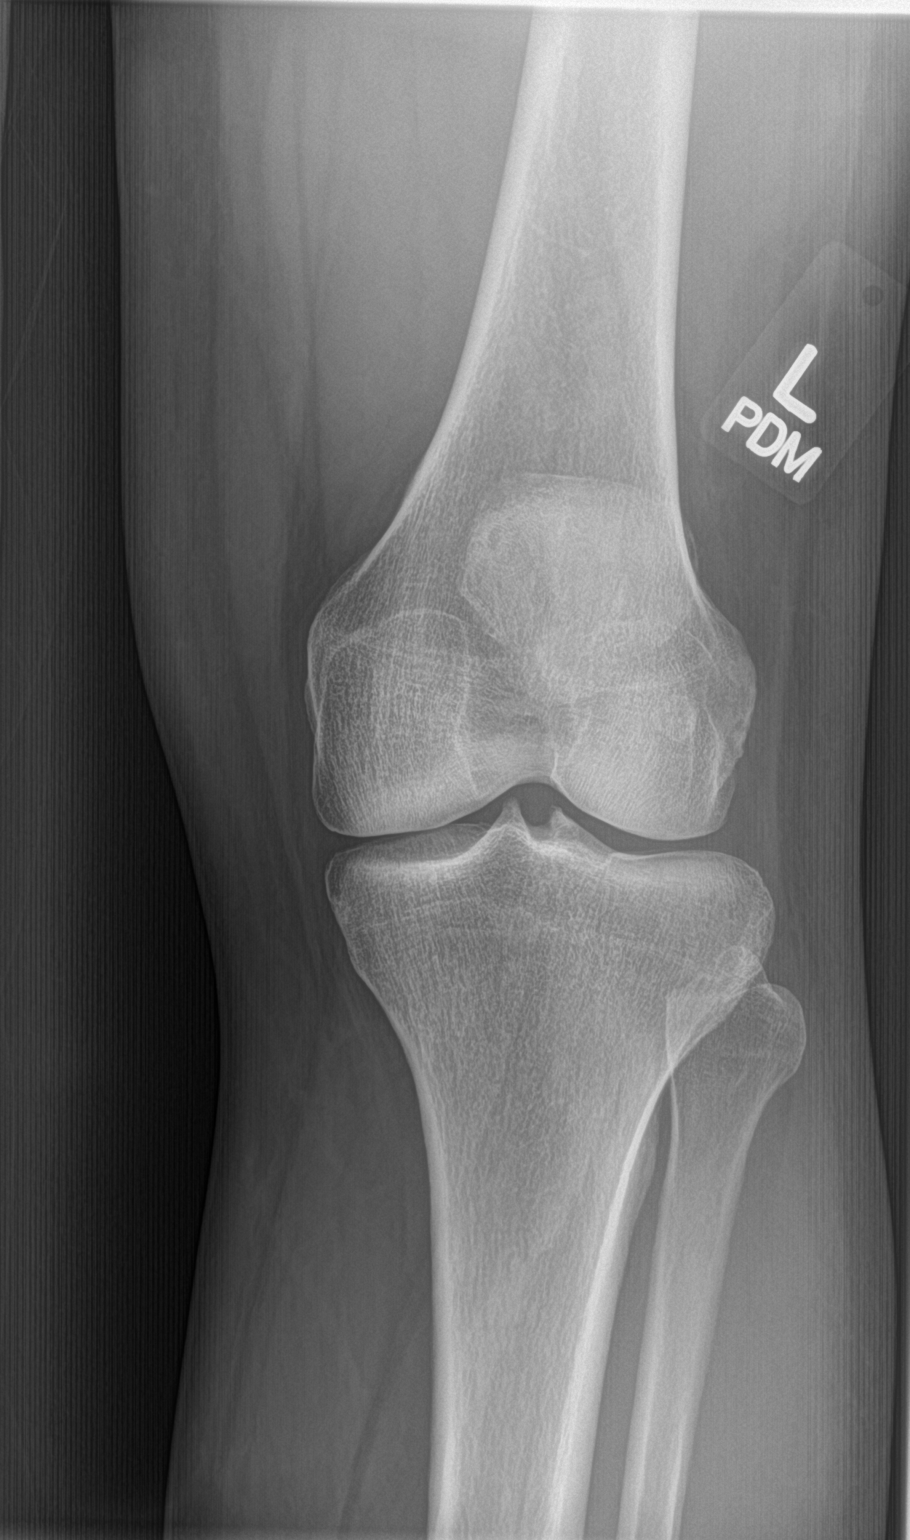

[knee tunnel]
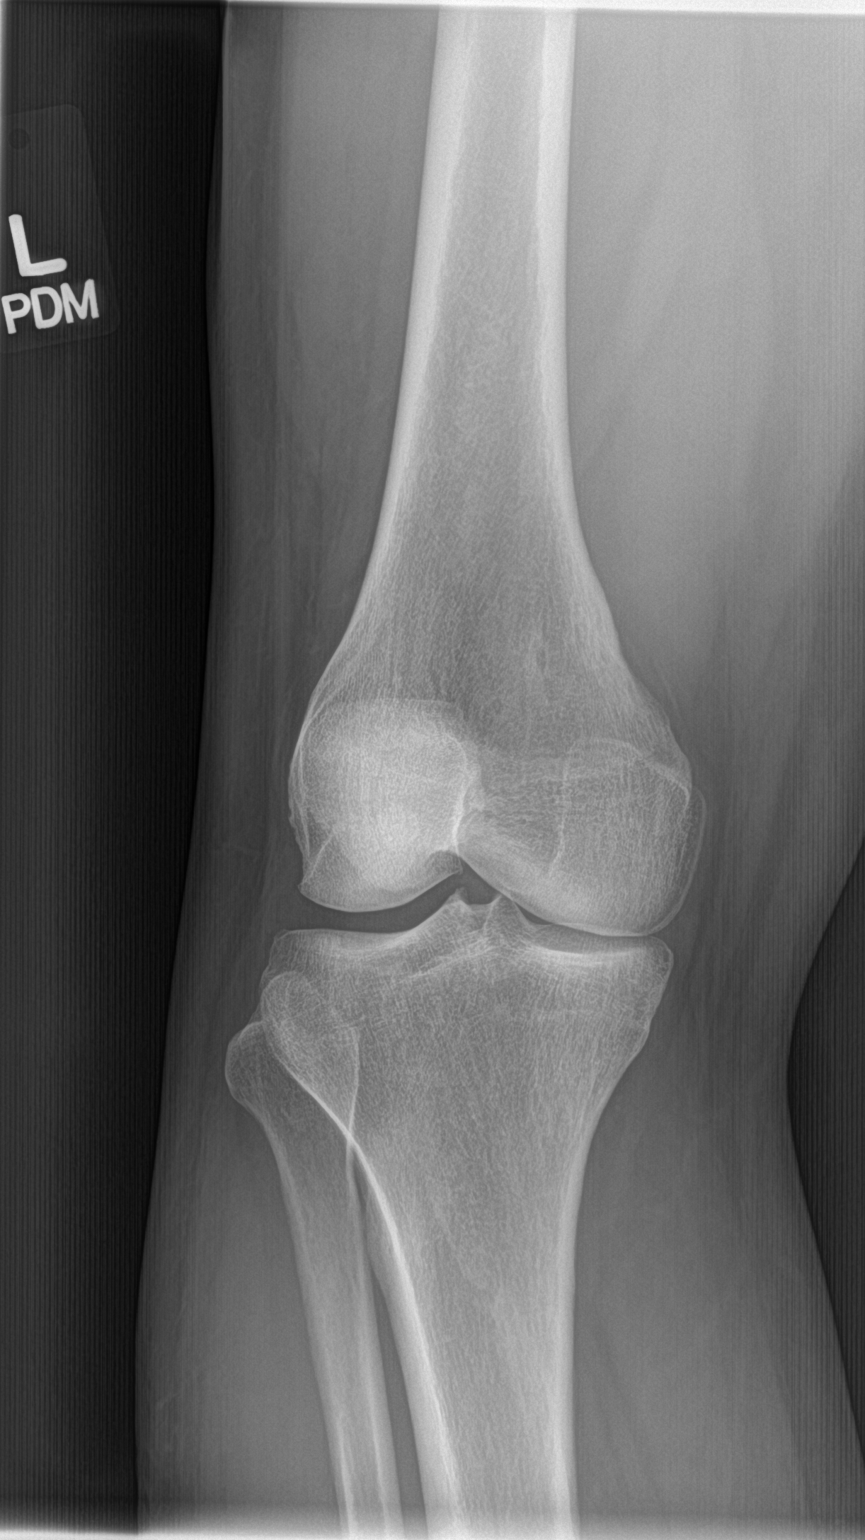

[knee lat]
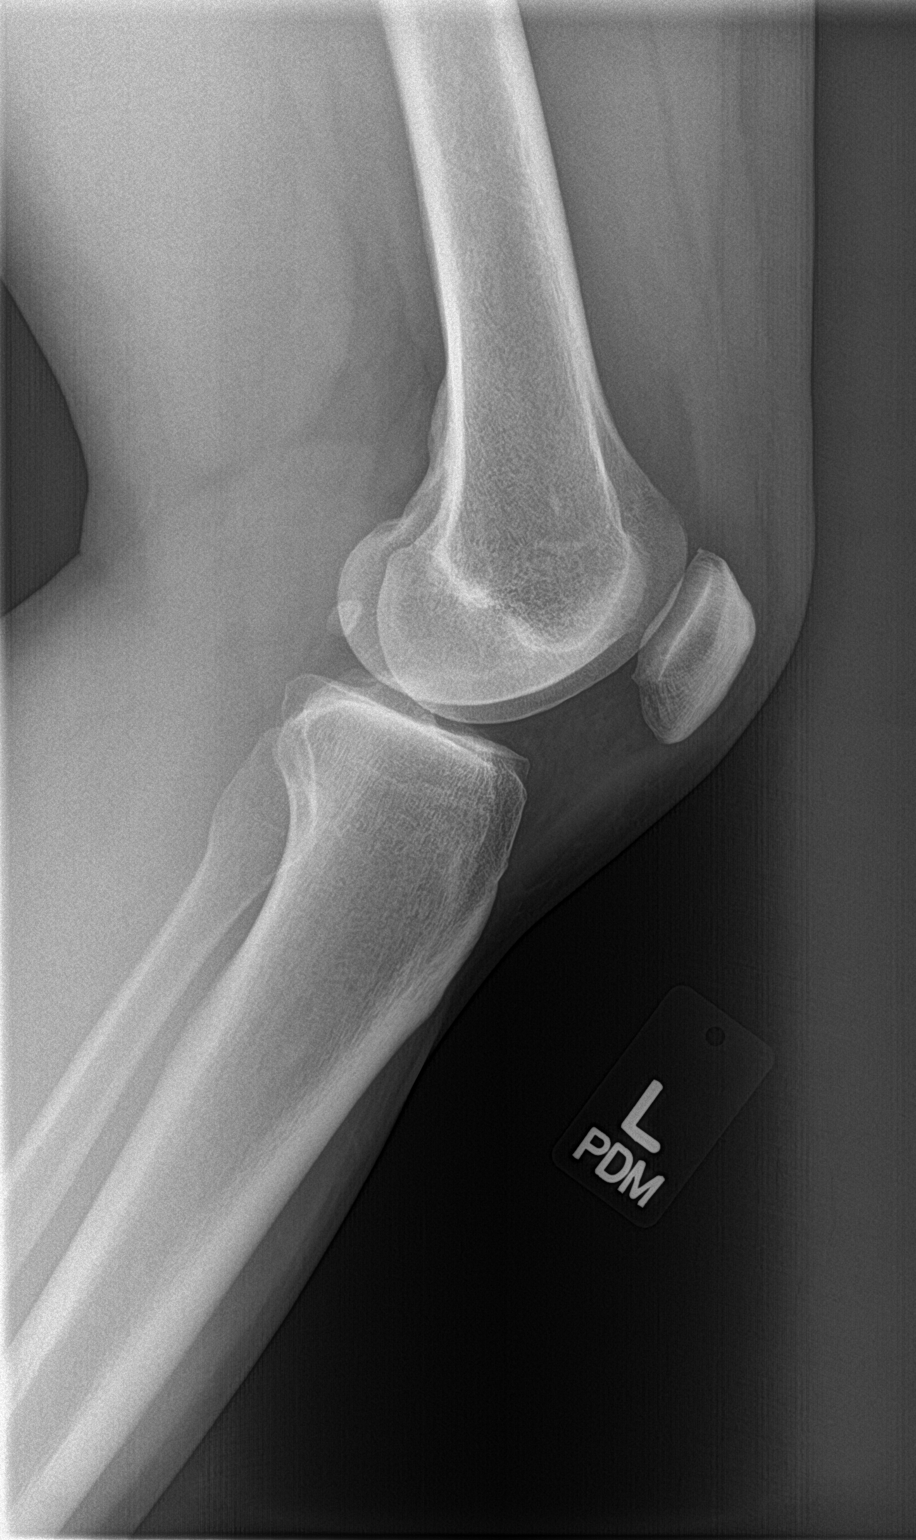

[sunrise]
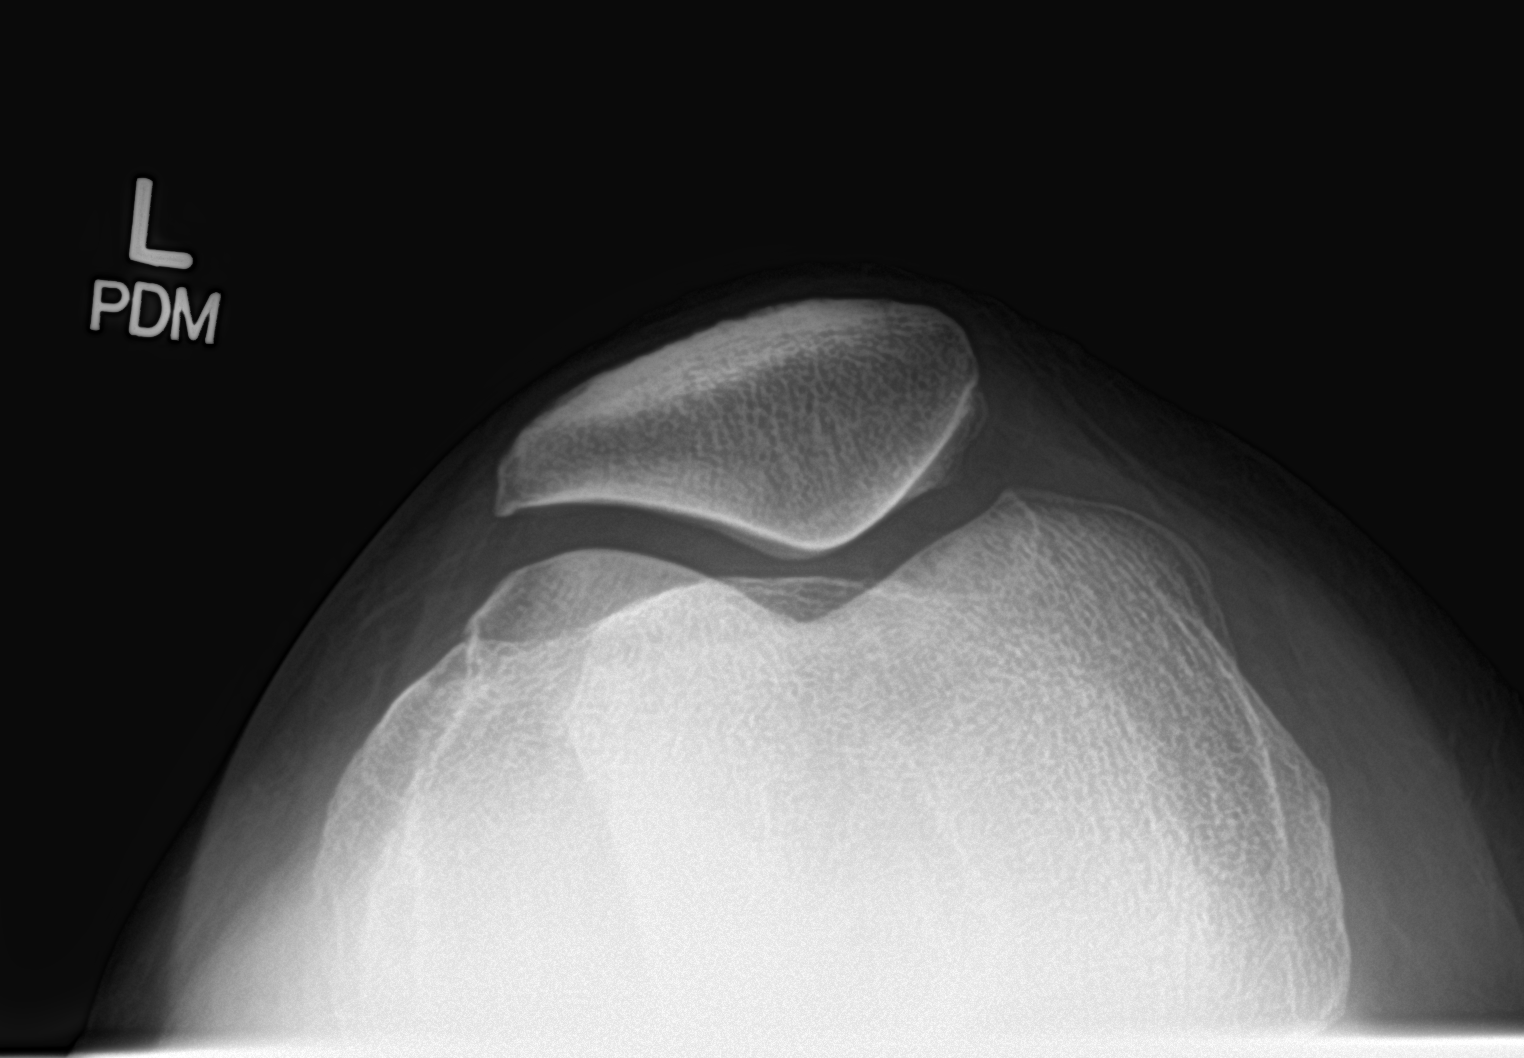

[4 of 4 positions shown; findings below may reference images not displayed]

FINDINGS: The knee joint spaces are relatively well preserved for age with
minimal degenerative change. No fracture or effusion is seen. The
patella is normally positioned.
IMPRESSION: Very little degenerative change.  No fracture or effusion.

## 2015-12-04 ENCOUNTER — Other Ambulatory Visit: Payer: Self-pay | Admitting: Internal Medicine

## 2015-12-04 NOTE — Telephone Encounter (Signed)
Pt has not been seen since 06/2013. No follow up visit scheduled.

## 2015-12-06 ENCOUNTER — Other Ambulatory Visit: Payer: Self-pay | Admitting: Internal Medicine

## 2015-12-06 DIAGNOSIS — D508 Other iron deficiency anemias: Secondary | ICD-10-CM

## 2015-12-06 DIAGNOSIS — K5909 Other constipation: Secondary | ICD-10-CM

## 2015-12-06 DIAGNOSIS — E559 Vitamin D deficiency, unspecified: Secondary | ICD-10-CM

## 2015-12-14 ENCOUNTER — Telehealth: Payer: Self-pay | Admitting: Internal Medicine

## 2015-12-14 NOTE — Telephone Encounter (Signed)
Please give Alice Vaughn the 4:30 appt time t on Wednesday November 1 . And let her know

## 2015-12-14 NOTE — Telephone Encounter (Signed)
appointment scheduled and patient notified.

## 2015-12-19 ENCOUNTER — Ambulatory Visit: Payer: 59 | Admitting: Internal Medicine

## 2015-12-19 ENCOUNTER — Other Ambulatory Visit: Payer: Self-pay | Admitting: Internal Medicine

## 2015-12-19 DIAGNOSIS — G8929 Other chronic pain: Secondary | ICD-10-CM

## 2015-12-19 DIAGNOSIS — M25511 Pain in right shoulder: Principal | ICD-10-CM

## 2015-12-21 ENCOUNTER — Encounter: Payer: Self-pay | Admitting: Internal Medicine

## 2015-12-26 ENCOUNTER — Ambulatory Visit: Payer: 59 | Admitting: Internal Medicine

## 2015-12-27 ENCOUNTER — Encounter (INDEPENDENT_AMBULATORY_CARE_PROVIDER_SITE_OTHER): Payer: Self-pay

## 2015-12-27 ENCOUNTER — Other Ambulatory Visit (HOSPITAL_COMMUNITY)
Admission: RE | Admit: 2015-12-27 | Discharge: 2015-12-27 | Disposition: A | Discharge status: Home / Self Care | Payer: 59 | Source: Ambulatory Visit | Attending: Internal Medicine | Admitting: Internal Medicine

## 2015-12-27 ENCOUNTER — Encounter: Payer: Self-pay | Admitting: Internal Medicine

## 2015-12-27 ENCOUNTER — Ambulatory Visit (INDEPENDENT_AMBULATORY_CARE_PROVIDER_SITE_OTHER): Payer: 59 | Admitting: Internal Medicine

## 2015-12-27 VITALS — BP 104/76 | HR 58 | Temp 98.4°F | Resp 12 | Ht 63.0 in | Wt 137.5 lb

## 2015-12-27 DIAGNOSIS — D508 Other iron deficiency anemias: Secondary | ICD-10-CM

## 2015-12-27 DIAGNOSIS — E559 Vitamin D deficiency, unspecified: Secondary | ICD-10-CM | POA: Diagnosis not present

## 2015-12-27 DIAGNOSIS — Z1239 Encounter for other screening for malignant neoplasm of breast: Secondary | ICD-10-CM

## 2015-12-27 DIAGNOSIS — Z113 Encounter for screening for infections with a predominantly sexual mode of transmission: Secondary | ICD-10-CM | POA: Insufficient documentation

## 2015-12-27 DIAGNOSIS — Z124 Encounter for screening for malignant neoplasm of cervix: Secondary | ICD-10-CM | POA: Diagnosis not present

## 2015-12-27 DIAGNOSIS — N760 Acute vaginitis: Secondary | ICD-10-CM

## 2015-12-27 DIAGNOSIS — Z1151 Encounter for screening for human papillomavirus (HPV): Secondary | ICD-10-CM | POA: Insufficient documentation

## 2015-12-27 DIAGNOSIS — K5909 Other constipation: Secondary | ICD-10-CM | POA: Diagnosis not present

## 2015-12-27 DIAGNOSIS — Z01411 Encounter for gynecological examination (general) (routine) with abnormal findings: Secondary | ICD-10-CM | POA: Insufficient documentation

## 2015-12-27 DIAGNOSIS — D509 Iron deficiency anemia, unspecified: Secondary | ICD-10-CM

## 2015-12-27 DIAGNOSIS — Z1231 Encounter for screening mammogram for malignant neoplasm of breast: Secondary | ICD-10-CM

## 2015-12-27 DIAGNOSIS — M7521 Bicipital tendinitis, right shoulder: Secondary | ICD-10-CM | POA: Insufficient documentation

## 2015-12-27 DIAGNOSIS — E782 Mixed hyperlipidemia: Secondary | ICD-10-CM | POA: Diagnosis not present

## 2015-12-27 DIAGNOSIS — Z0000 Encounter for general adult medical examination without abnormal findings: Secondary | ICD-10-CM

## 2015-12-27 LAB — COMPREHENSIVE METABOLIC PANEL
ALT: 16 U/L (ref 0–35)
AST: 20 U/L (ref 0–37)
Albumin: 4.1 g/dL (ref 3.5–5.2)
Alkaline Phosphatase: 33 U/L — ABNORMAL LOW (ref 39–117)
BUN: 20 mg/dL (ref 6–23)
CHLORIDE: 102 meq/L (ref 96–112)
CO2: 31 meq/L (ref 19–32)
CREATININE: 1.01 mg/dL (ref 0.40–1.20)
Calcium: 9.6 mg/dL (ref 8.4–10.5)
GFR: 75.16 mL/min (ref >60.00)
GLUCOSE: 95 mg/dL (ref 70–99)
POTASSIUM: 4.1 meq/L (ref 3.5–5.1)
SODIUM: 138 meq/L (ref 135–145)
Total Bilirubin: 0.4 mg/dL (ref 0.2–1.2)
Total Protein: 6.8 g/dL (ref 6.0–8.3)

## 2015-12-27 LAB — CBC WITH DIFFERENTIAL/PLATELET
BASOS PCT: 0.4 % (ref 0.0–3.0)
Basophils Absolute: 0 10*3/uL (ref 0.0–0.1)
EOS PCT: 1.2 % (ref 0.0–5.0)
Eosinophils Absolute: 0.1 10*3/uL (ref 0.0–0.7)
HCT: 33.8 % — ABNORMAL LOW (ref 36.0–46.0)
Hemoglobin: 11.3 g/dL — ABNORMAL LOW (ref 12.0–15.0)
LYMPHS ABS: 1.3 10*3/uL (ref 0.7–4.0)
Lymphocytes Relative: 23 % (ref 12.0–46.0)
MCHC: 33.3 g/dL (ref 30.0–36.0)
MCV: 87.9 fl (ref 78.0–100.0)
MONO ABS: 0.3 10*3/uL (ref 0.1–1.0)
MONOS PCT: 6.3 % (ref 3.0–12.0)
NEUTROS ABS: 3.8 10*3/uL (ref 1.4–7.7)
NEUTROS PCT: 69.1 % (ref 43.0–77.0)
Platelets: 206 10*3/uL (ref 150.0–400.0)
RBC: 3.85 Mil/uL — ABNORMAL LOW (ref 3.87–5.11)
RDW: 15.3 % (ref 11.5–15.5)
WBC: 5.5 10*3/uL (ref 4.0–10.5)

## 2015-12-27 LAB — LIPID PANEL
CHOLESTEROL: 199 mg/dL (ref 0–200)
HDL: 74.9 mg/dL (ref >39.00)
LDL Cholesterol: 112 mg/dL — ABNORMAL HIGH (ref 0–99)
NONHDL: 124.01
Total CHOL/HDL Ratio: 3
Triglycerides: 58 mg/dL (ref 0.0–149.0)
VLDL: 11.6 mg/dL (ref 0.0–40.0)

## 2015-12-27 LAB — FERRITIN: FERRITIN: 14.5 ng/mL (ref 10.0–291.0)

## 2015-12-27 LAB — VITAMIN D 25 HYDROXY (VIT D DEFICIENCY, FRACTURES): VITD: 29.65 ng/mL — ABNORMAL LOW (ref 30.00–100.00)

## 2015-12-27 MED ORDER — MELOXICAM 15 MG PO TABS: 15.0000 mg | ORAL_TABLET | Freq: Every day | ORAL | 1 refills | Status: DC

## 2015-12-27 NOTE — Assessment & Plan Note (Signed)
PAP smear was done today 

## 2015-12-27 NOTE — Progress Notes (Signed)
Pre-visit discussion using our clinic review tool. No additional management support is needed unless otherwise documented below in the visit note.  

## 2015-12-27 NOTE — Progress Notes (Signed)
Patient ID: Alice Vaughn, female    DOB: April 14, 1965  Age: 50 y.o. MRN: [id number]  The patient is here for annual physical preventive. examination   The risk factors are reflected in the social history.  The roster of all physicians providing medical care to patient - is listed in the Snapshot section of the chart.  Activities of daily living:  The patient is 100% independent in all ADLs: dressing, toileting, feeding as well as independent mobility  Home safety : The patient has smoke detectors in the home. They wear seatbelts.  There are no firearms at home. There is no violence in the home.   There is no risks for hepatitis, STDs or HIV. There is no   history of blood transfusion. They have no travel history to infectious disease endemic areas of the world.  The patient has seen their dentist in the last six month. They have seen their eye doctor in the last year.   Discussed the need for sun protection: hats, long sleeves and use of sunscreen if there is significant sun exposure.   Diet: the importance of a healthy diet is discussed. They do have a healthy diet.  The benefits of regular aerobic exercise were discussed. She exercises 5 days per week  60 minutes.   Depression screen: there are no signs or vegative symptoms of depression- irritability, change in appetite, anhedonia, sadness/tearfullness. .  The following portions of the patient's history were reviewed and updated as appropriate: allergies, current medications, past family history, past medical history,  past surgical history, past social history  and problem list.  Visual acuity was not assessed per patient preference since she has regular follow up with her ophthalmologist. Hearing and body mass index were assessed and reviewed.   During the course of the visit the patient was educated and counseled about appropriate screening and preventive services including : fall prevention , diabetes screening, nutrition  counseling, colorectal cancer screening, and recommended immunizations.    CC: The primary encounter diagnosis was Mixed hyperlipidemia. Diagnoses of Breast cancer screening, Iron deficiency anemia, unspecified iron deficiency anemia type, Screening for cervical cancer, Encounter for preventive health examination, Chronic constipation, Vitamin D deficiency, Other iron deficiency anemia, Vaginitis and vulvovaginitis, Screening for breast cancer, and Biceps tendonitis on right were also pertinent to this visit.  History Alice Vaughn has a past medical history of Anemia.   She has a past surgical history that includes Tubal ligation.   Her family history includes Alcohol abuse in her maternal grandfather; COPD in her maternal grandfather; Diabetes in her maternal grandmother; Hypertension in her maternal grandmother.She reports that she has never smoked. She does not have any smokeless tobacco history on file. She reports that she drinks about 0.6 oz of alcohol per week . Her drug history is not on file.  Outpatient Medications Prior to Visit  Medication Sig Dispense Refill  . cyclobenzaprine (FLEXERIL) 10 MG tablet Take 1 tablet (10 mg total) by mouth 3 (three) times daily as needed for muscle spasms. 60 tablet 0  . furosemide (LASIX) 20 MG tablet TAKE ONE TABLET BY MOUTH ONCE DAILY AS NEEDED FOR  FLUID  RETENTION 30 tablet 0  . Linaclotide (LINZESS) 290 MCG CAPS capsule Take 1 capsule (290 mcg total) by mouth daily. 30 capsule 5  . triamcinolone cream (KENALOG) 0.1 % Apply to affected area twice daily until resolved 80 g 0  . predniSONE (DELTASONE) 10 MG tablet 6 tablets on Day 1 , then reduce by  1 tablet daily until gone (Patient not taking: Reported on 12/27/2015) 21 tablet 0  . Vitamin D, Ergocalciferol, (DRISDOL) 50000 UNITS CAPS capsule Take 1 capsule (50,000 Units total) by mouth every 7 (seven) days. (Patient not taking: Reported on 12/27/2015) 8 capsule 0  . meloxicam (MOBIC) 15 MG tablet TAKE  ONE TABLET BY MOUTH ONCE DAILY (Patient not taking: Reported on 12/27/2015) 30 tablet 2   No facility-administered medications prior to visit.     Review of Systems  Patient denies headache, fevers, malaise, unintentional weight loss, skin rash, eye pain, sinus congestion and sinus pain, sore throat, dysphagia,  hemoptysis , cough, dyspnea, wheezing, chest pain, palpitations, orthopnea, edema, abdominal pain, nausea, melena, diarrhea, constipation, flank pain, dysuria, hematuria, urinary  Frequency, nocturia, numbness, tingling, seizures,  Focal weakness, Loss of consciousness,  Tremor, insomnia, depression, anxiety, and suicidal ideation.     Objective:  BP 104/76   Pulse (!) 58   Temp 98.4 F (36.9 C) (Oral)   Resp 12   Ht 5\' 3"  (1.6 m)   Wt 137 lb 8 oz (62.4 kg)   LMP 12/20/2015   SpO2 100%   BMI 24.36 kg/m   Physical Exam   General Appearance:    Alert, cooperative, no distress, appears stated age  Head:    Normocephalic, without obvious abnormality, atraumatic  Eyes:    PERRL, conjunctiva/corneas clear, EOM's intact, fundi    benign, both eyes  Ears:    Normal TM's and external ear canals, both ears  Nose:   Nares normal, septum midline, mucosa normal, no drainage    or sinus tenderness  Throat:   Lips, mucosa, and tongue normal; teeth and gums normal  Neck:   Supple, symmetrical, trachea midline, no adenopathy;    thyroid:  no enlargement/tenderness/nodules; no carotid   bruit or JVD  Back:     Symmetric, no curvature, ROM normal, no CVA tenderness  Lungs:     Clear to auscultation bilaterally, respirations unlabored  Chest Wall:    No tenderness or deformity   Heart:    Regular rate and rhythm, S1 and S2 normal, no murmur, rub   or gallop  Breast Exam:    No tenderness, masses, or nipple abnormality  Abdomen:     Soft, non-tender, bowel sounds active all four quadrants,    no masses, no organomegaly  Genitalia:    Pelvic: cervix normal in appearance, external  genitalia normal, no adnexal masses or tenderness, no cervical motion tenderness, rectovaginal septum normal, uterus normal size, shape, and consistency and vagina normal without discharge  Extremities:   Extremities normal, atraumatic, no cyanosis or edema  Pulses:   2+ and symmetric all extremities  Skin:   Skin color, texture, turgor normal, no rashes or lesions  Lymph nodes:   Cervical, supraclavicular, and axillary nodes normal  Neurologic:   CNII-XII intact, normal strength, sensation and reflexes    throughout      Assessment & Plan:   Problem List Items Addressed This Visit    Encounter for preventive health examination    Annual comprehensive preventive exam was done as well as an evaluation and management of chronic conditions .  During the course of the visit the patient was educated and counseled about appropriate screening and preventive services including :  diabetes screening, lipid analysis with projected  10 year  risk for CAD , nutrition counseling, breast, cervical and colorectal cancer screening, and recommended immunizations.  Printed recommendations for health maintenance screenings was given  Screening for breast cancer    Breast exam was done,  3d mammogram ordered      Screening for cervical cancer    PAP smear was done today       Relevant Orders   Cytology - PAP   Biceps tendonitis on right   Vitamin D deficiency   Anemia, iron deficiency   Relevant Orders   Iron and TIBC   Ferritin   Chronic constipation    Other Visit Diagnoses    Mixed hyperlipidemia    -  Primary   Relevant Orders   Lipid panel   Breast cancer screening       Relevant Orders   MM SCREENING BREAST TOMO BILATERAL   Vaginitis and vulvovaginitis       Relevant Orders   Cytology - PAP      I have changed Alice Vaughn's meloxicam. I am also having her maintain her cyclobenzaprine, Vitamin D (Ergocalciferol), predniSONE, triamcinolone cream, linaclotide, and  furosemide.  Meds ordered this encounter  Medications  . meloxicam (MOBIC) 15 MG tablet    Sig: Take 1 tablet (15 mg total) by mouth daily.    Dispense:  90 tablet    Refill:  1    Please consider 90 day supplies to promote better adherence    Medications Discontinued During This Encounter  Medication Reason  . meloxicam (MOBIC) 15 MG tablet Reorder    Follow-up: No Follow-up on file.   Sherlene Shams, MD

## 2015-12-27 NOTE — Assessment & Plan Note (Signed)
Annual comprehensive preventive exam was done as well as an evaluation and management of chronic conditions .  During the course of the visit the patient was educated and counseled about appropriate screening and preventive services including :  diabetes screening, lipid analysis with projected  10 year  risk for CAD , nutrition counseling, breast, cervical and colorectal cancer screening, and recommended immunizations.  Printed recommendations for health maintenance screenings was given 

## 2015-12-27 NOTE — Patient Instructions (Addendum)
Your referral to Dr Charlann Boxer has been approved,  You are free to call his office and set up your appointment  Sudafed PE up to 30 mg every 6 hours for facial pain and sinus congestion  Flush  sinuses with salt water or Milta Deiters Med's rinse  Gargle with salt  Water for sore throat   PUMPKIN, LEMON AND BLUEBERRY!!   3D mammogram has been ordered  Health Maintenance, Female Adopting a healthy lifestyle and getting preventive care can go a long way to promote health and wellness. Talk with your health care provider about what schedule of regular examinations is right for you. This is a good chance for you to check in with your provider about disease prevention and staying healthy. In between checkups, there are plenty of things you can do on your own. Experts have done a lot of research about which lifestyle changes and preventive measures are most likely to keep you healthy. Ask your health care provider for more information. WEIGHT AND DIET  Eat a healthy diet  Be sure to include plenty of vegetables, fruits, low-fat dairy products, and lean protein.  Do not eat a lot of foods high in solid fats, added sugars, or salt.  Get regular exercise. This is one of the most important things you can do for your health.  Most adults should exercise for at least 150 minutes each week. The exercise should increase your heart rate and make you sweat (moderate-intensity exercise).  Most adults should also do strengthening exercises at least twice a week. This is in addition to the moderate-intensity exercise.  Maintain a healthy weight  Body mass index (BMI) is a measurement that can be used to identify possible weight problems. It estimates body fat based on height and weight. Your health care provider can help determine your BMI and help you achieve or maintain a healthy weight.  For females 72 years of age and older:   A BMI below 18.5 is considered underweight.  A BMI of 18.5 to 24.9 is  normal.  A BMI of 25 to 29.9 is considered overweight.  A BMI of 30 and above is considered obese.  Watch levels of cholesterol and blood lipids  You should start having your blood tested for lipids and cholesterol at 48 years of age, then have this test every 5 years.  You may need to have your cholesterol levels checked more often if:  Your lipid or cholesterol levels are high.  You are older than 48 years of age.  You are at high risk for heart disease.  CANCER SCREENING   Lung Cancer  Lung cancer screening is recommended for adults 83-33 years old who are at high risk for lung cancer because of a history of smoking.  A yearly low-dose CT scan of the lungs is recommended for people who:  Currently smoke.  Have quit within the past 15 years.  Have at least a 30-pack-year history of smoking. A pack year is smoking an average of one pack of cigarettes a day for 1 year.  Yearly screening should continue until it has been 15 years since you quit.  Yearly screening should stop if you develop a health problem that would prevent you from having lung cancer treatment.  Breast Cancer  Practice breast self-awareness. This means understanding how your breasts normally appear and feel.  It also means doing regular breast self-exams. Let your health care provider know about any changes, no matter how small.  If you  are in your 20s or 30s, you should have a clinical breast exam (CBE) by a health care provider every 1-3 years as part of a regular health exam.  If you are 66 or older, have a CBE every year. Also consider having a breast X-ray (mammogram) every year.  If you have a family history of breast cancer, talk to your health care provider about genetic screening.  If you are at high risk for breast cancer, talk to your health care provider about having an MRI and a mammogram every year.  Breast cancer gene (BRCA) assessment is recommended for women who have family members  with BRCA-related cancers. BRCA-related cancers include:  Breast.  Ovarian.  Tubal.  Peritoneal cancers.  Results of the assessment will determine the need for genetic counseling and BRCA1 and BRCA2 testing. Cervical Cancer Your health care provider may recommend that you be screened regularly for cancer of the pelvic organs (ovaries, uterus, and vagina). This screening involves a pelvic examination, including checking for microscopic changes to the surface of your cervix (Pap test). You may be encouraged to have this screening done every 3 years, beginning at age 84.  For women ages 66-65, health care providers may recommend pelvic exams and Pap testing every 3 years, or they may recommend the Pap and pelvic exam, combined with testing for human papilloma virus (HPV), every 5 years. Some types of HPV increase your risk of cervical cancer. Testing for HPV may also be done on women of any age with unclear Pap test results.  Other health care providers may not recommend any screening for nonpregnant women who are considered low risk for pelvic cancer and who do not have symptoms. Ask your health care provider if a screening pelvic exam is right for you.  If you have had past treatment for cervical cancer or a condition that could lead to cancer, you need Pap tests and screening for cancer for at least 20 years after your treatment. If Pap tests have been discontinued, your risk factors (such as having a new sexual partner) need to be reassessed to determine if screening should resume. Some women have medical problems that increase the chance of getting cervical cancer. In these cases, your health care provider may recommend more frequent screening and Pap tests. Colorectal Cancer  This type of cancer can be detected and often prevented.  Routine colorectal cancer screening usually begins at 48 years of age and continues through 48 years of age.  Your health care provider may recommend  screening at an earlier age if you have risk factors for colon cancer.  Your health care provider may also recommend using home test kits to check for hidden blood in the stool.  A small camera at the end of a tube can be used to examine your colon directly (sigmoidoscopy or colonoscopy). This is done to check for the earliest forms of colorectal cancer.  Routine screening usually begins at age 72.  Direct examination of the colon should be repeated every 5-10 years through 48 years of age. However, you may need to be screened more often if early forms of precancerous polyps or small growths are found. Skin Cancer  Check your skin from head to toe regularly.  Tell your health care provider about any new moles or changes in moles, especially if there is a change in a mole's shape or color.  Also tell your health care provider if you have a mole that is larger than the size of  a pencil eraser.  Always use sunscreen. Apply sunscreen liberally and repeatedly throughout the day.  Protect yourself by wearing long sleeves, pants, a wide-brimmed hat, and sunglasses whenever you are outside. HEART DISEASE, DIABETES, AND HIGH BLOOD PRESSURE   High blood pressure causes heart disease and increases the risk of stroke. High blood pressure is more likely to develop in:  People who have blood pressure in the high end of the normal range (130-139/85-89 mm Hg).  People who are overweight or obese.  People who are African American.  If you are 60-107 years of age, have your blood pressure checked every 3-5 years. If you are 69 years of age or older, have your blood pressure checked every year. You should have your blood pressure measured twice--once when you are at a hospital or clinic, and once when you are not at a hospital or clinic. Record the average of the two measurements. To check your blood pressure when you are not at a hospital or clinic, you can use:  An automated blood pressure machine at a  pharmacy.  A home blood pressure monitor.  If you are between 57 years and 70 years old, ask your health care provider if you should take aspirin to prevent strokes.  Have regular diabetes screenings. This involves taking a blood sample to check your fasting blood sugar level.  If you are at a normal weight and have a low risk for diabetes, have this test once every three years after 48 years of age.  If you are overweight and have a high risk for diabetes, consider being tested at a younger age or more often. PREVENTING INFECTION  Hepatitis B  If you have a higher risk for hepatitis B, you should be screened for this virus. You are considered at high risk for hepatitis B if:  You were born in a country where hepatitis B is common. Ask your health care provider which countries are considered high risk.  Your parents were born in a high-risk country, and you have not been immunized against hepatitis B (hepatitis B vaccine).  You have HIV or AIDS.  You use needles to inject street drugs.  You live with someone who has hepatitis B.  You have had sex with someone who has hepatitis B.  You get hemodialysis treatment.  You take certain medicines for conditions, including cancer, organ transplantation, and autoimmune conditions. Hepatitis C  Blood testing is recommended for:  Everyone born from [birth year] through 1963.  Anyone with known risk factors for hepatitis C. Sexually transmitted infections (STIs)  You should be screened for sexually transmitted infections (STIs) including gonorrhea and chlamydia if:  You are sexually active and are younger than 48 years of age.  You are older than 48 years of age and your health care provider tells you that you are at risk for this type of infection.  Your sexual activity has changed since you were last screened and you are at an increased risk for chlamydia or gonorrhea. Ask your health care provider if you are at risk.  If you do not  have HIV, but are at risk, it may be recommended that you take a prescription medicine daily to prevent HIV infection. This is called pre-exposure prophylaxis (PrEP). You are considered at risk if:  You are sexually active and do not regularly use condoms or know the HIV status of your partner(s).  You take drugs by injection.  You are sexually active with a partner who has HIV.  Talk with your health care provider about whether you are at high risk of being infected with HIV. If you choose to begin PrEP, you should first be tested for HIV. You should then be tested every 3 months for as long as you are taking PrEP.  PREGNANCY   If you are premenopausal and you may become pregnant, ask your health care provider about preconception counseling.  If you may become pregnant, take 400 to 800 micrograms (mcg) of folic acid every day.  If you want to prevent pregnancy, talk to your health care provider about birth control (contraception). OSTEOPOROSIS AND MENOPAUSE   Osteoporosis is a disease in which the bones lose minerals and strength with aging. This can result in serious bone fractures. Your risk for osteoporosis can be identified using a bone density scan.  If you are 55 years of age or older, or if you are at risk for osteoporosis and fractures, ask your health care provider if you should be screened.  Ask your health care provider whether you should take a calcium or vitamin D supplement to lower your risk for osteoporosis.  Menopause may have certain physical symptoms and risks.  Hormone replacement therapy may reduce some of these symptoms and risks. Talk to your health care provider about whether hormone replacement therapy is right for you.  HOME CARE INSTRUCTIONS   Schedule regular health, dental, and eye exams.  Stay current with your immunizations.   Do not use any tobacco products including cigarettes, chewing tobacco, or electronic cigarettes.  If you are pregnant, do not  drink alcohol.  If you are breastfeeding, limit how much and how often you drink alcohol.  Limit alcohol intake to no more than 1 drink per day for nonpregnant women. One drink equals 12 ounces of beer, 5 ounces of wine, or 1 ounces of hard liquor.  Do not use street drugs.  Do not share needles.  Ask your health care provider for help if you need support or information about quitting drugs.  Tell your health care provider if you often feel depressed.  Tell your health care provider if you have ever been abused or do not feel safe at home.   This information is not intended to replace advice given to you by your health care provider. Make sure you discuss any questions you have with your health care provider.   Document Released: 08/26/2010 Document Revised: 03/03/2014 Document Reviewed: 01/12/2013 Elsevier Interactive Patient Education [employer].

## 2015-12-27 NOTE — Assessment & Plan Note (Signed)
Breast exam was done,  3d mammogram ordered

## 2015-12-28 LAB — IRON AND TIBC
%SAT: 24 % (ref 11–50)
IRON: 70 ug/dL (ref 40–190)
TIBC: 295 ug/dL (ref 250–450)
UIBC: 225 ug/dL (ref 125–400)

## 2015-12-31 ENCOUNTER — Encounter: Payer: Self-pay | Admitting: *Deleted

## 2015-12-31 LAB — CERVICOVAGINAL ANCILLARY ONLY: Candida vaginitis: NEGATIVE

## 2016-01-01 LAB — CYTOLOGY - PAP
CHLAMYDIA, DNA PROBE: NEGATIVE
DIAGNOSIS: NEGATIVE
HPV: NOT DETECTED
Neisseria Gonorrhea: NEGATIVE
TRICH (WINDOWPATH): NEGATIVE

## 2016-01-01 LAB — CERVICOVAGINAL ANCILLARY ONLY: Herpes: NEGATIVE

## 2016-01-01 MED ORDER — METRONIDAZOLE 500 MG PO TABS: 500.0000 mg | ORAL_TABLET | Freq: Two times a day (BID) | ORAL | 0 refills | Status: DC

## 2016-01-01 NOTE — Addendum Note (Signed)
Addended by: Sherlene ShamsULLO, TERESA L on: 01/01/2016 07:46 AM   Modules accepted: Orders

## 2016-01-02 NOTE — Progress Notes (Signed)
Tawana ScaleZach Diyana Vaughn D.O. [facility] [address] [place], [place] [address] Phone: [phone] Subjective:    I'm seeing this patient by the request  of:  TULLO, [name], MD   CC: Right shoulder pain  BJY:[id number]:Subjective  Alice B Rande LawmanCrump Vaughn is a 50 y.o. female coming in with complaint of right shoulder pain. More bilaterally. Patient states that seemed to start with the right arm in mood the left arm. Denies any significant neck pain but states that sometimes the pain radiates towards her neck. Patient denies any radiation down the arm. Denies any weakness. Continues to work out on a regular basis. Rates the severity of pain though is 4 out of 10 and does not seem to be resolving. Pain is waking her up more at night as well. Has tried anti-inflammatory with no significant benefit. Patient has decreased weight which did help her right shoulder somewhat but still not completely going away.     Past Medical History:  Diagnosis Date  . Anemia    Past Surgical History:  Procedure Laterality Date  . TUBAL LIGATION     Social History   Social History  . Marital status: Divorced    Spouse name: N/A  . Number of children: 4  . Years of education: 4016   Occupational History  . Personal trainer   . Ordained Optician, dispensingMinister    Social History Main Topics  . Smoking status: Never Smoker  . Smokeless tobacco: Not on file  . Alcohol use 0.6 oz/week    1 Glasses of wine per week  . Drug use: Unknown  . Sexual activity: Yes    Birth control/ protection: Other-see comments, Surgical   Other Topics Concern  . Not on file   Social History Narrative   Married to Alice Vaughn.  4 children from previous marriage: 3 daughters,  one son   No Known Allergies Family History  Problem Relation Age of Onset  . Hypertension Maternal Grandmother   . Diabetes Maternal Grandmother   . Alcohol abuse Maternal Grandfather   . COPD Maternal Grandfather     Past medical history, social, surgical  and family history all reviewed in electronic medical record.  No pertanent information unless stated regarding to the chief complaint.   Review of Systems:Review of systems updated and as accurate as of 01/02/16  No headache, visual changes, nausea, vomiting, diarrhea, constipation, dizziness, abdominal pain, skin rash, fevers, chills, night sweats, weight loss, swollen lymph nodes, body aches, joint swelling, muscle aches, chest pain, shortness of breath, mood changes.   Objective  Last menstrual period 12/20/2015. Systems examined below as of 01/02/16   General: No apparent distress alert and oriented x3 mood and affect normal, dressed appropriately.  HEENT: Pupils equal, extraocular movements intact  Respiratory: Patient's speak in full sentences and does not appear short of breath  Cardiovascular: No lower extremity edema, non tender, no erythema  Skin: Warm dry intact with no signs of infection or rash on extremities or on axial skeleton.  Abdomen: Soft nontender  Neuro: Cranial nerves II through XII are intact, neurovascularly intact in all extremities with 2+ DTRs and 2+ pulses.  Lymph: No lymphadenopathy of posterior or anterior cervical chain or axillae bilaterally.  Gait normal with good balance and coordination.  MSK:  Non tender with full range of motion and good stability and symmetric strength and tone of , elbows, wrist, hip, knee and ankles bilaterally.  Shoulder: Bilateral Inspection reveals no abnormalities, atrophy or asymmetry. Palpation  is normal with no tenderness over AC joint or bicipital groove. ROM is full in all planes passively. Rotator cuff strength normal throughout. signs of impingement with positive Neer and Hawkin's tests, but negative empty can sign. Speeds and Yergason's tests normal. No labral pathology noted with negative Obrien's, negative clunk and good stability. Normal scapular function observed. No painful arc and no drop arm sign. No  apprehension sign  Osteopathic findings C2 flexed rotated and side bent left T3 extended rotated and side bent left with inhaled third rib T7 extended rotated and side bent right L4 flexed rotated and side bent right  Procedure note 97110; 15 minutes spent for Therapeutic exercises as stated in above notes.  This included exercises focusing on stretching, strengthening, with significant focus on eccentric aspects.   Basic scapular stabilization to include adduction and depression of scapula Scaption, focusing on proper movement and good control Internal and External rotation utilizing a theraband, with elbow tucked at side entire time Rows with theraband    Proper technique shown and discussed handout in great detail with ATC.  All questions were discussed and answered.     Impression and Recommendations:     This case required medical decision making of moderate complexity.      Note: This dictation was prepared with Dragon dictation along with smaller phrase technology. Any transcriptional errors that result from this process are unintentional.

## 2016-01-03 ENCOUNTER — Encounter: Payer: Self-pay | Admitting: Family Medicine

## 2016-01-03 ENCOUNTER — Ambulatory Visit (INDEPENDENT_AMBULATORY_CARE_PROVIDER_SITE_OTHER): Payer: 59 | Admitting: Family Medicine

## 2016-01-03 DIAGNOSIS — M940 Chondrocostal junction syndrome [Tietze]: Secondary | ICD-10-CM | POA: Diagnosis not present

## 2016-01-03 DIAGNOSIS — M999 Biomechanical lesion, unspecified: Secondary | ICD-10-CM | POA: Diagnosis not present

## 2016-01-03 MED ORDER — DICLOFENAC SODIUM 2 % TD SOLN: TRANSDERMAL | 3 refills | Status: DC

## 2016-01-03 NOTE — Assessment & Plan Note (Signed)
Problem for patient. Patient will try topical anti-inflammatories, postural control exercises. Patient work with Event organiserathletic trainer. We discussed icing regimen and home exercises. Patient will continue to stay active. Patient will follow-up with me again in 3-4 weeks for further evaluation. Responded well to osteopathic manipulation.

## 2016-01-03 NOTE — Assessment & Plan Note (Signed)
Decision today to treat with OMT was based on Physical Exam  After verbal consent patient was treated with HVLA, ME techniques in cervical, thoracic and rib areas  Patient tolerated the procedure well with improvement in symptoms  Patient given exercises, stretches and lifestyle modifications  See medications in patient instructions if given  Patient will follow up in 3-4 weeks      

## 2016-01-03 NOTE — Patient Instructions (Signed)
Good to see you  Ice is your friend Ice 20 minutes 2 times daily. Usually after activity and before bed. pennsaid pinkie amount topically 2 times daily as needed.  Tennis ball between shoulder blades with sitting a long amount of time.  Exercises 3 times a week.  Keep hands within peripheral vision.  I want to see you again in 3 weeks to make sure the rib stay in place.

## 2016-01-14 ENCOUNTER — Ambulatory Visit
Admission: RE | Admit: 2016-01-14 | Discharge: 2016-01-14 | Disposition: A | Discharge status: Home / Self Care | Payer: 59 | Source: Ambulatory Visit | Attending: Internal Medicine | Admitting: Internal Medicine

## 2016-01-14 DIAGNOSIS — Z1231 Encounter for screening mammogram for malignant neoplasm of breast: Secondary | ICD-10-CM | POA: Diagnosis not present

## 2016-01-14 DIAGNOSIS — Z1239 Encounter for other screening for malignant neoplasm of breast: Secondary | ICD-10-CM

## 2016-01-14 DIAGNOSIS — R928 Other abnormal and inconclusive findings on diagnostic imaging of breast: Secondary | ICD-10-CM | POA: Insufficient documentation

## 2016-01-15 ENCOUNTER — Inpatient Hospital Stay
Admission: RE | Admit: 2016-01-15 | Discharge: 2016-01-15 | Disposition: A | Discharge status: Home / Self Care | Payer: Self-pay | Source: Ambulatory Visit | Attending: *Deleted | Admitting: *Deleted

## 2016-01-15 ENCOUNTER — Other Ambulatory Visit: Payer: Self-pay | Admitting: *Deleted

## 2016-01-15 DIAGNOSIS — Z9289 Personal history of other medical treatment: Secondary | ICD-10-CM

## 2016-01-22 ENCOUNTER — Other Ambulatory Visit: Payer: Self-pay | Admitting: Internal Medicine

## 2016-01-22 DIAGNOSIS — B9689 Other specified bacterial agents as the cause of diseases classified elsewhere: Secondary | ICD-10-CM

## 2016-01-22 DIAGNOSIS — N760 Acute vaginitis: Principal | ICD-10-CM

## 2016-01-22 MED ORDER — METRONIDAZOLE 500 MG PO TABS: 500.0000 mg | ORAL_TABLET | Freq: Two times a day (BID) | ORAL | 0 refills | Status: DC

## 2016-01-23 NOTE — Progress Notes (Signed)
Tawana ScaleZach Smith D.O. [facility] [address] [place], [place] [address] Phone: [phone] Subjective:    I'm seeing this patient by the request  of:  TULLO, TERESA L, MD   CC: Right shoulder pain f/u   NUU:[id number]:Subjective  Alice Vaughn is a 50 y.o. female coming in with complaint of right shoulder pain. 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 Patient did respond well to osteopathic manipulation and home exercises. Patient had meloxicam for breakthrough pain as well as muscle relaxer. Patient states Doing very well overall. Very minimal discomfort of the shoulders. Did have a good response to the manipulation previously. Has been doing some postural exercises. Denies any significant pain whatsoever. Mild increasing tightness over the course last week review of the upper back and right shoulder but very minimal overall.     Past Medical History:  Diagnosis Date  . Anemia    Past Surgical History:  Procedure Laterality Date  . TUBAL LIGATION     Social History   Social History  . Marital status: Legally Separated    Spouse name: N/A  . Number of children: 4  . 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 Smoking status: Never Smoker  . Smokeless tobacco: None  . Alcohol use 0.6 oz/week    1 Glasses of wine per week  . Drug use: Unknown  . Sexual activity: Yes    Birth control/ protection: Other-see comments, Surgical   Other Topics Concern  . None   Social History Narrative   Married to [name].  4 children from previous marriage: 3 daughters,  one son   No Known Allergies Family History  Problem Relation Age of Onset  . Hypertension Maternal Grandmother   . Diabetes Maternal Grandmother   . Alcohol abuse Maternal Grandfather   . COPD Maternal Grandfather     Past medical history, social, surgical and family  history all reviewed in electronic medical record.  No pertanent information unless stated regarding to the chief complaint.   Review of Systems:Review of systems updated and as accurate as of 01/24/16  No headache, visual changes, nausea, vomiting, diarrhea, constipation, dizziness, abdominal pain, skin rash, fevers, chills, night sweats, weight loss, swollen lymph nodes, body aches, joint swelling, muscle aches, chest pain, shortness of breath, mood changes.   Objective  Blood pressure 120/80, pulse 61, height 5' 3.5" (1.613 m), weight 135 lb (61.2 kg), last menstrual period 12/20/2015, SpO2 97 %.   Systems examined below as of 01/24/16 General: NAD A&O x3 mood, affect normal  HEENT: Pupils equal, extraocular movements intact no nystagmus Respiratory: not short of breath at rest or with speaking Cardiovascular: No lower extremity edema, non tender Skin: Warm dry intact with no signs of infection or rash on extremities or on axial skeleton. Abdomen: Soft nontender, no masses Neuro: Cranial nerves  intact, neurovascularly intact in all extremities with 2+ DTRs and 2+ pulses. Lymph: No lymphadenopathy appreciated today  Gait normal with good balance and coordination.  MSK: Non tender with full range of motion and good stability and symmetric strength and tone of elbows, wrist,  knee hips and ankles bilaterally.    Shoulder: Bilateral Inspection reveals no abnormalities, atrophy or asymmetry. Palpation is normal with no tenderness over AC joint or bicipital groove. ROM is full in all  planes passively. Rotator cuff strength normal throughout. No impingement  Speeds and Yergason's tests normal. No labral pathology noted with negative Obrien's, negative clunk and good stability. Normal scapular function observed. No painful arc and no drop arm sign. No apprehension sign  Osteopathic findings C4 flexed rotated and side bent left T3 extended rotated and side bent left with inhaled third  rib T9 extended rotated and side bent right L2 flexed rotated and side bent right     Impression and Recommendations:     This case required medical decision making of moderate complexity.      Note: This dictation was prepared with Dragon dictation along with smaller phrase technology. Any transcriptional errors that result from this process are unintentional.

## 2016-01-24 ENCOUNTER — Ambulatory Visit (INDEPENDENT_AMBULATORY_CARE_PROVIDER_SITE_OTHER): Payer: 59 | Admitting: Family Medicine

## 2016-01-24 ENCOUNTER — Encounter: Payer: Self-pay | Admitting: Family Medicine

## 2016-01-24 VITALS — BP 120/80 | HR 61 | Ht 63.5 in | Wt 135.0 lb

## 2016-01-24 DIAGNOSIS — M999 Biomechanical lesion, unspecified: Secondary | ICD-10-CM

## 2016-01-24 DIAGNOSIS — M940 Chondrocostal junction syndrome [Tietze]: Secondary | ICD-10-CM

## 2016-01-24 NOTE — Assessment & Plan Note (Signed)
Decision today to treat with OMT was based on Physical Exam  After verbal consent patient was treated with HVLA, ME techniques in cervical, thoracic and rib areas  Patient tolerated the procedure well with improvement in symptoms  Patient given exercises, stretches and lifestyle modifications  See medications in patient instructions if given  Patient will follow up in 6-8 weeks                

## 2016-01-24 NOTE — Patient Instructions (Signed)
God to see you  Alice Vaughn is your friend.  Stay active Consider compression shirts or look at recovery shirts.  If I can help your daughters knee I am here  See me again in 6 weeks.

## 2016-01-24 NOTE — Assessment & Plan Note (Signed)
Patient seems to be doing relatively well. We discussed with patient at great length. Patient has responded well to conservative therapy including osteopathic manipulation. Patient will continue with current care. Return in 6-8 weeks.

## 2016-03-01 NOTE — Progress Notes (Deleted)
[facility] [address] [place], [place] [address] Phone: [phone] Subjective:    I'm seeing this patient by the request  of:  TULLO, TERESA L, MD   CC: Right shoulder pain f/u   BJY:NWGNFAOZHY  Alice Vaughn is a 51 y.o. female coming in with complaint of right shoulder pain. 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 Patient did respond well to osteopathic manipulation and home exercises. Patient had meloxicam for breakthrough pain as well as muscle relaxer. Patient states Doing very well overall. Very minimal discomfort of the shoulders. Did have a good response to the manipulation previously. Has been doing some postural exercises. Denies any significant pain whatsoever. Mild increasing tightness over the course last week review of the upper back and right shoulder but very minimal overall.     Past Medical History:  Diagnosis Date  . Anemia    Past Surgical History:  Procedure Laterality Date  . TUBAL LIGATION     Social History   Social History  . Marital status: Legally Separated    Spouse name: N/A  . Number of children: 4  . Years of education: 39   Occupational History  . Personal trainer   . Ordained Optician, dispensing    Social History Main Topics  . Smoking status: Never Smoker  . Smokeless tobacco: Not on file  . Alcohol use 0.6 oz/week    1 Glasses of wine per week  . Drug use: Unknown  . Sexual activity: Yes    Birth control/ protection: Other-see comments, Surgical   Other Topics Concern  . Not on file   Social History Narrative   Married to [employer].  4 children from previous marriage: 3 daughters,  one son   No Known Allergies Family History  Problem Relation Age of Onset  . Hypertension Maternal Grandmother   . Diabetes Maternal Grandmother   . Alcohol abuse Maternal Grandfather   . COPD Maternal Grandfather     Past medical history, social,  surgical and family history all reviewed in electronic medical record.  No pertanent information unless stated regarding to the chief complaint.   Review of Systems:Review of systems updated and as accurate as of 03/01/16  No headache, visual changes, nausea, vomiting, diarrhea, constipation, dizziness, abdominal pain, skin rash, fevers, chills, night sweats, weight loss, swollen lymph nodes, body aches, joint swelling, muscle aches, chest pain, shortness of breath, mood changes.   Objective  There were no vitals taken for this visit.   Systems examined below as of 03/01/16 General: NAD A&O x3 mood, affect normal  HEENT: Pupils equal, extraocular movements intact no nystagmus Respiratory: not short of breath at rest or with speaking Cardiovascular: No lower extremity edema, non tender Skin: Warm dry intact with no signs of infection or rash on extremities or on axial skeleton. Abdomen: Soft nontender, no masses Neuro: Cranial nerves  intact, neurovascularly intact in all extremities with 2+ DTRs and 2+ pulses. Lymph: No lymphadenopathy appreciated today  Gait normal with good balance and coordination.  MSK: Non tender with full range of motion and good stability and symmetric strength and tone of elbows, wrist,  knee hips and ankles bilaterally.    Shoulder: Bilateral Inspection reveals no abnormalities, atrophy or asymmetry. Palpation is normal with no tenderness over AC joint or bicipital groove. ROM is full in all planes passively. Rotator cuff strength normal throughout. No impingement  Speeds and Yergason's tests normal. No labral pathology noted with negative Obrien's, negative clunk and good stability. Normal scapular function observed. No painful arc and no drop arm sign. No apprehension sign  Osteopathic findings C4 flexed rotated and side bent left T3 extended rotated and side bent left with inhaled third rib T9 extended rotated and side bent right L2 flexed rotated and  side bent right     Impression and Recommendations:     This case required medical decision making of moderate complexity.      Note: This dictation was prepared with Dragon dictation along with smaller phrase technology. Any transcriptional errors that result from this process are unintentional.

## 2016-03-03 ENCOUNTER — Ambulatory Visit: Payer: 59 | Admitting: Family Medicine

## 2016-03-07 ENCOUNTER — Other Ambulatory Visit: Payer: Self-pay | Admitting: Internal Medicine

## 2016-03-07 DIAGNOSIS — N760 Acute vaginitis: Principal | ICD-10-CM

## 2016-03-07 DIAGNOSIS — B9689 Other specified bacterial agents as the cause of diseases classified elsewhere: Secondary | ICD-10-CM

## 2016-03-07 MED ORDER — METRONIDAZOLE 500 MG PO TABS: 500.0000 mg | ORAL_TABLET | Freq: Two times a day (BID) | ORAL | 0 refills | Status: DC

## 2016-03-11 NOTE — Progress Notes (Deleted)
Alice Vaughn D.O. [facility] [address] [place], [place] [address] Phone: [phone] Subjective:    I'm seeing this patient by the request  of:  TULLO, TERESA L, MD   CC: Right shoulder pain f/u   BJY:NWGNFAOZHYHPI:Subjective  Alice Vaughn is a 51 y.o. female coming in with complaint of right shoulder pain. 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 Patient did respond well to osteopathic manipulation and home exercises. Patient had meloxicam for breakthrough pain as well as muscle relaxer. Patient states Doing very well overall. Very minimal discomfort of the shoulders. Did have a good response to the manipulation previously. Has been doing some postural exercises. Denies any significant pain whatsoever. Mild increasing tightness over the course last week review of the upper back and right shoulder but very minimal overall.     Past Medical History:  Diagnosis Date  . Anemia    Past Surgical History:  Procedure Laterality Date  . TUBAL LIGATION     Social History   Social History  . Marital status: Legally Separated    Spouse name: N/A  . Number of children: 4  . 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 Smoking status: Never Smoker  . Smokeless tobacco: Not on file  . Alcohol use 0.6 oz/week    1 Glasses of wine per week  . Drug use: Unknown  . Sexual activity: Yes    Birth control/ protection: Other-see comments, Surgical   Other Topics Concern  . Not on file   Social History Narrative   Married to [name].  4 children from previous marriage: 3 daughters,  one son   No Known Allergies Family History  Problem Relation Age of Onset  . Hypertension Maternal Grandmother   . Diabetes Maternal Grandmother   . Alcohol abuse Maternal Grandfather   . COPD Maternal Grandfather     Past medical history, social,  surgical and family history all reviewed in electronic medical record.  No pertanent information unless stated regarding to the chief complaint.   Review of Systems:Review of systems updated and as accurate as of 03/11/16  No headache, visual changes, nausea, vomiting, diarrhea, constipation, dizziness, abdominal pain, skin rash, fevers, chills, night sweats, weight loss, swollen lymph nodes, body aches, joint swelling, muscle aches, chest pain, shortness of breath, mood changes.   Objective  There were no vitals taken for this visit.   Systems examined below as of 03/11/16 General: NAD A&O x3 mood, affect normal  HEENT: Pupils equal, extraocular movements intact no nystagmus Respiratory: not short of breath at rest or with speaking Cardiovascular: No lower extremity edema, non tender Skin: Warm dry intact with no signs of infection or rash on extremities or on axial skeleton. Abdomen: Soft nontender, no masses Neuro: Cranial nerves  intact, neurovascularly intact in all extremities with 2+ DTRs and 2+ pulses. Lymph: No lymphadenopathy appreciated today  Gait normal with good balance and coordination.  MSK: Non tender with full range of motion and good stability and symmetric strength and tone of elbows, wrist,  knee hips and ankles bilaterally.    Shoulder: Bilateral Inspection reveals no abnormalities, atrophy or asymmetry. Palpation is normal with no tenderness over AC joint or bicipital groove. ROM is full in all planes passively. Rotator cuff strength normal throughout. No impingement  Speeds and Yergason's tests normal. No labral pathology noted with negative Obrien's, negative clunk and good stability. Normal scapular function observed. No painful arc and no drop arm sign. No apprehension sign  Osteopathic findings C4 flexed rotated and side bent left T3 extended rotated and side bent left with inhaled third rib T9 extended rotated and side bent right L2 flexed rotated and  side bent right     Impression and Recommendations:     This case required medical decision making of moderate complexity.      Note: This dictation was prepared with Dragon dictation along with smaller phrase technology. Any transcriptional errors that result from this process are unintentional.

## 2016-03-12 ENCOUNTER — Ambulatory Visit: Payer: 59 | Admitting: Family Medicine

## 2016-03-19 ENCOUNTER — Other Ambulatory Visit: Payer: Self-pay | Admitting: Internal Medicine

## 2016-03-19 MED ORDER — FUROSEMIDE 20 MG PO TABS: ORAL_TABLET | ORAL | 4 refills | Status: DC

## 2016-03-19 MED ORDER — FLUCONAZOLE 150 MG PO TABS: 150.0000 mg | ORAL_TABLET | Freq: Every day | ORAL | 0 refills | Status: DC

## 2016-05-21 ENCOUNTER — Other Ambulatory Visit: Payer: Self-pay | Admitting: Internal Medicine

## 2016-05-21 ENCOUNTER — Other Ambulatory Visit (INDEPENDENT_AMBULATORY_CARE_PROVIDER_SITE_OTHER): Payer: Self-pay

## 2016-05-21 ENCOUNTER — Telehealth: Payer: Self-pay | Admitting: Internal Medicine

## 2016-05-21 DIAGNOSIS — Z113 Encounter for screening for infections with a predominantly sexual mode of transmission: Secondary | ICD-10-CM

## 2016-05-21 NOTE — Telephone Encounter (Signed)
Today is patient half day she would like to come this afternoon, scheduled for 3 PM.

## 2016-05-21 NOTE — Telephone Encounter (Signed)
Labs ordered.

## 2016-05-22 ENCOUNTER — Other Ambulatory Visit: Payer: Self-pay | Admitting: Internal Medicine

## 2016-05-22 DIAGNOSIS — Z20828 Contact with and (suspected) exposure to other viral communicable diseases: Secondary | ICD-10-CM | POA: Insufficient documentation

## 2016-05-22 LAB — HSV(HERPES SIMPLEX VRS) I + II AB-IGG
HSV 1 Glycoprotein G Ab, IgG: 51.3 Index — ABNORMAL HIGH (ref <0.90)
HSV 2 GLYCOPROTEIN G AB, IGG: 18.4 {index} — AB (ref <0.90)

## 2016-05-22 MED ORDER — ACYCLOVIR 400 MG PO TABS: 400.0000 mg | ORAL_TABLET | Freq: Every day | ORAL | 1 refills | Status: DC

## 2016-08-11 ENCOUNTER — Other Ambulatory Visit: Payer: Self-pay | Admitting: Internal Medicine

## 2016-08-11 DIAGNOSIS — N760 Acute vaginitis: Principal | ICD-10-CM

## 2016-08-11 DIAGNOSIS — B9689 Other specified bacterial agents as the cause of diseases classified elsewhere: Secondary | ICD-10-CM

## 2016-08-11 MED ORDER — METRONIDAZOLE 500 MG PO TABS: 500.0000 mg | ORAL_TABLET | Freq: Two times a day (BID) | ORAL | 0 refills | Status: DC

## 2016-11-19 NOTE — Telephone Encounter (Signed)
Error

## 2016-12-08 ENCOUNTER — Other Ambulatory Visit: Payer: Self-pay | Admitting: Internal Medicine

## 2017-06-01 ENCOUNTER — Other Ambulatory Visit: Payer: Self-pay | Admitting: Internal Medicine

## 2017-06-01 NOTE — Telephone Encounter (Signed)
Patient well known to me .  I refilled

## 2017-06-01 NOTE — Telephone Encounter (Signed)
Last OV: 12/27/2015 Next OV: not scheduled

## 2017-06-03 ENCOUNTER — Other Ambulatory Visit: Payer: Self-pay | Admitting: Internal Medicine

## 2017-12-16 ENCOUNTER — Other Ambulatory Visit: Payer: Self-pay | Admitting: Internal Medicine

## 2017-12-16 NOTE — Telephone Encounter (Signed)
Attempted to call pt. Mailbox full. Need to schedule pt a follow up appt with Dr. Darrick Huntsman. Pt has not been seen since 2017.

## 2017-12-31 ENCOUNTER — Other Ambulatory Visit: Payer: Self-pay | Admitting: Internal Medicine

## 2017-12-31 NOTE — Telephone Encounter (Signed)
Last OV 12/27/2015   Pt needs and OV has not been seen since 2017

## 2018-01-17 ENCOUNTER — Other Ambulatory Visit: Payer: Self-pay | Admitting: Internal Medicine

## 2018-01-18 ENCOUNTER — Other Ambulatory Visit: Payer: Self-pay | Admitting: Internal Medicine

## 2018-01-18 MED ORDER — FUROSEMIDE 20 MG PO TABS
20.0000 mg | ORAL_TABLET | Freq: Every day | ORAL | 1 refills | Status: DC
Start: 2018-01-18 — End: 2018-11-04

## 2018-01-18 MED ORDER — ACYCLOVIR 400 MG PO TABS: 400.0000 mg | ORAL_TABLET | Freq: Every day | ORAL | 1 refills | Status: DC

## 2018-09-13 ENCOUNTER — Other Ambulatory Visit: Payer: Self-pay | Admitting: Internal Medicine

## 2018-09-13 MED ORDER — SULFAMETHOXAZOLE-TRIMETHOPRIM 800-160 MG PO TABS: 1.0000 | ORAL_TABLET | Freq: Two times a day (BID) | ORAL | 0 refills | Status: DC

## 2018-10-06 ENCOUNTER — Other Ambulatory Visit: Payer: Self-pay | Admitting: Internal Medicine

## 2018-10-06 MED ORDER — ACYCLOVIR 400 MG PO TABS: 400.0000 mg | ORAL_TABLET | Freq: Every day | ORAL | 0 refills | Status: DC

## 2018-10-31 ENCOUNTER — Other Ambulatory Visit: Payer: Self-pay | Admitting: Internal Medicine

## 2018-11-02 NOTE — Telephone Encounter (Signed)
Pt has not been seen since 2017

## 2018-11-24 ENCOUNTER — Telehealth: Payer: Self-pay | Admitting: Internal Medicine

## 2018-11-24 NOTE — Telephone Encounter (Signed)
Lm to call office to schedule a physical.

## 2019-03-13 ENCOUNTER — Other Ambulatory Visit: Payer: Self-pay | Admitting: Internal Medicine

## 2019-03-14 NOTE — Telephone Encounter (Signed)
Refilled: 11/04/2018 Last OV: 12/27/2015 Next OV: not scheduled

## 2019-05-25 ENCOUNTER — Other Ambulatory Visit: Payer: Self-pay | Admitting: Internal Medicine

## 2019-07-04 ENCOUNTER — Other Ambulatory Visit: Payer: Self-pay | Admitting: Internal Medicine

## 2019-10-17 ENCOUNTER — Other Ambulatory Visit: Payer: Self-pay | Admitting: Internal Medicine

## 2019-12-19 ENCOUNTER — Other Ambulatory Visit: Payer: Self-pay | Admitting: Internal Medicine

## 2020-06-07 ENCOUNTER — Other Ambulatory Visit: Payer: Self-pay | Admitting: Internal Medicine

## 2020-06-26 ENCOUNTER — Telehealth: Payer: Self-pay | Admitting: Internal Medicine

## 2020-06-26 NOTE — Telephone Encounter (Signed)
Can you schedule my dear friend  Alice Vaughn for a CPE with PAP?  We will do labs same day as there may be additional ones needed .  It has been a long time  Since she has been here and phone numbers may have changed so let   Phone [phone]  Any day but a Friday   You can use a 12:00 on Tuesday if need be

## 2020-07-05 ENCOUNTER — Encounter: Payer: Self-pay | Admitting: Internal Medicine

## 2020-07-05 ENCOUNTER — Other Ambulatory Visit: Payer: Self-pay

## 2020-07-05 ENCOUNTER — Other Ambulatory Visit (HOSPITAL_COMMUNITY): Admission: RE | Admit: 2020-07-05 | Payer: Self-pay | Source: Ambulatory Visit | Admitting: Internal Medicine

## 2020-07-05 ENCOUNTER — Ambulatory Visit (INDEPENDENT_AMBULATORY_CARE_PROVIDER_SITE_OTHER): Payer: Self-pay | Admitting: Internal Medicine

## 2020-07-05 VITALS — BP 100/64 | HR 83 | Temp 97.4°F | Resp 14 | Ht 63.5 in | Wt 146.6 lb

## 2020-07-05 DIAGNOSIS — Z124 Encounter for screening for malignant neoplasm of cervix: Secondary | ICD-10-CM

## 2020-07-05 DIAGNOSIS — Z0000 Encounter for general adult medical examination without abnormal findings: Secondary | ICD-10-CM

## 2020-07-05 DIAGNOSIS — R5383 Other fatigue: Secondary | ICD-10-CM

## 2020-07-05 DIAGNOSIS — Z1231 Encounter for screening mammogram for malignant neoplasm of breast: Secondary | ICD-10-CM

## 2020-07-05 DIAGNOSIS — E782 Mixed hyperlipidemia: Secondary | ICD-10-CM

## 2020-07-05 DIAGNOSIS — B3731 Acute candidiasis of vulva and vagina: Secondary | ICD-10-CM

## 2020-07-05 DIAGNOSIS — B373 Candidiasis of vulva and vagina: Secondary | ICD-10-CM

## 2020-07-05 NOTE — Patient Instructions (Signed)
Your annual mammogram has been ordered.  You are encouraged (required) to call to make your appointment at [facility]  [phone]  ° ° °

## 2020-07-05 NOTE — Assessment & Plan Note (Signed)

## 2020-07-05 NOTE — Progress Notes (Signed)
Patient ID: Alice Vaughn, female    DOB: 1966-02-11  Age: 55 y.o. MRN: [id number]  The patient is here for annual PREVENTIVE  examination .  This visit occurred during the SARS-CoV-2 public health emergency.  Safety protocols were in place, including screening questions prior to the visit, additional usage of staff PPE, and extensive cleaning of exam room while observing appropriate contact time as indicated for disinfecting solutions.      The risk factors are reflected in the social history.  The roster of all physicians providing medical care to patient - is listed in the Snapshot section of the chart.  Activities of daily living:  The patient is 100% independent in all ADLs: dressing, toileting, feeding as well as independent mobility  Home safety : The patient has smoke detectors in the home. They wear seatbelts.  There are no firearms at home. There is no violence in the home.   There is no risks for hepatitis, STDs or HIV. There is no   history of blood transfusion. They have no travel history to infectious disease endemic areas of the world.  The patient has seen their dentist in the last six month. They have seen their eye doctor in the last year. She denies  hearing difficulty with regard to whispered voices and some television programs.  They have deferred audiologic testing in the last year.  They do not  have excessive sun exposure. Discussed the need for sun protection: hats, long sleeves and use of sunscreen if there is significant sun exposure.   Diet: the importance of a healthy diet is discussed. They do have a healthy diet.  The benefits of regular aerobic exercise were discussed. She exercises vigorously  5 times per week , for at least 60 minutes.   Depression screen: there are no signs or vegative symptoms of depression- irritability, change in appetite, anhedonia, sadness/tearfullness.  The following portions of the patient's history were reviewed and updated as  appropriate: allergies, current medications, past family history, past medical history,  past surgical history, past social history  and problem list.  Visual acuity was not assessed per patient preference since she has regular follow up with her ophthalmologist. Hearing and body mass index were assessed and reviewed.   During the course of the visit the patient was educated and counseled about appropriate screening and preventive services including : fall prevention , diabetes screening, nutrition counseling, colorectal cancer screening, and recommended immunizations.    CC: The primary encounter diagnosis was Cervical cancer screening. Diagnoses of Fatigue, unspecified type, Moderate mixed hyperlipidemia not requiring statin therapy, and Encounter for preventive health examination were also pertinent to this visit.   No issues addressed today.  History Modene has a past medical history of Anemia.   She has a past surgical history that includes Tubal ligation.   Her family history includes Alcohol abuse in her maternal grandfather; COPD in her maternal grandfather; Diabetes in her maternal grandmother; Hypertension in her maternal grandmother.She reports that she has never smoked. She has never used smokeless tobacco. She reports current alcohol use of about 1.0 standard drink of alcohol per week. No history on file for drug use.  Outpatient Medications Prior to Visit  Medication Sig Dispense Refill  . acyclovir (ZOVIRAX) 400 MG tablet Take 1 tablet (400 mg total) by mouth daily. 90 tablet 1  . Diclofenac Sodium (PENNSAID) 2 % SOLN Apply 1 pump twice daily as needed. 112 g 3  . furosemide (LASIX) 20 MG tablet  Take 1 tablet by mouth once daily 90 tablet 0  . acyclovir (ZOVIRAX) 400 MG tablet TAKE 1 TABLET BY MOUTH FIVE TIMES DAILY 35 tablet 0  . cyclobenzaprine (FLEXERIL) 10 MG tablet Take 1 tablet (10 mg total) by mouth 3 (three) times daily as needed for muscle spasms. 60 tablet 0  .  fluconazole (DIFLUCAN) 150 MG tablet TAKE ONE TABLET BY MOUTH ONCE DAILY FOR 2 DAYS 2 tablet 0  . meloxicam (MOBIC) 15 MG tablet Take 1 tablet (15 mg total) by mouth daily. 90 tablet 1  . metroNIDAZOLE (FLAGYL) 500 MG tablet Take 1 tablet (500 mg total) by mouth 2 (two) times daily. 14 tablet 0  . sulfamethoxazole-trimethoprim (BACTRIM DS) 800-160 MG tablet Take 1 tablet by mouth 2 (two) times daily. 10 tablet 0   No facility-administered medications prior to visit.    Review of Systems   Patient denies headache, fevers, malaise, unintentional weight loss, skin rash, eye pain, sinus congestion and sinus pain, sore throat, dysphagia,  hemoptysis , cough, dyspnea, wheezing, chest pain, palpitations, orthopnea, edema, abdominal pain, nausea, melena, diarrhea, constipation, flank pain, dysuria, hematuria, urinary  Frequency, nocturia, numbness, tingling, seizures,  Focal weakness, Loss of consciousness,  Tremor, insomnia, depression, anxiety, and suicidal ideation.     Objective:  BP 100/64 (BP Location: Left Arm, Patient Position: Sitting, Cuff Size: Normal)   Pulse 83   Temp (!) 97.4 F (36.3 C) (Temporal)   Resp 14   Ht 5' 3.5" (1.613 m)   Wt 146 lb 9.6 oz (66.5 kg)   SpO2 99%   BMI 25.56 kg/m   Physical Exam  General Appearance:    Alert, cooperative, no distress, appears stated age  Head:    Normocephalic, without obvious abnormality, atraumatic  Eyes:    PERRL, conjunctiva/corneas clear, EOM's intact, fundi    benign, both eyes  Ears:    Normal TM's and external ear canals, both ears  Nose:   Nares normal, septum midline, mucosa normal, no drainage    or sinus tenderness  Throat:   Lips, mucosa, and tongue normal; teeth and gums normal  Neck:   Supple, symmetrical, trachea midline, no adenopathy;    thyroid:  no enlargement/tenderness/nodules; no carotid   bruit or JVD  Back:     Symmetric, no curvature, ROM normal, no CVA tenderness  Lungs:     Clear to auscultation  bilaterally, respirations unlabored  Chest Wall:    No tenderness or deformity   Heart:    Regular rate and rhythm, S1 and S2 normal, no murmur, rub   or gallop  Breast Exam:    No tenderness, masses, or nipple abnormality  Abdomen:     Soft, non-tender, bowel sounds active all four quadrants,    no masses, no organomegaly  Genitalia:    Pelvic: cervix normal in appearance, external genitalia normal, no adnexal masses or tenderness, no cervical motion tenderness, rectovaginal septum normal, uterus normal size, shape, and consistency and vagina normal with evidence of yeast infection   Extremities:   Extremities normal, atraumatic, no cyanosis or edema  Pulses:   2+ and symmetric all extremities  Skin:   Skin color, texture, turgor normal, no rashes or lesions  Lymph nodes:   Cervical, supraclavicular, and axillary nodes normal  Neurologic:   CNII-XII intact, normal strength, sensation and reflexes    throughout    Assessment & Plan:   Problem List Items Addressed This Visit      Unprioritized   Encounter  for preventive health examination    age appropriate education and counseling updated, referrals for preventative services and immunizations addressed, dietary and smoking counseling addressed, most recent labs reviewed.  I have personally reviewed and have noted:  1) the patient's medical and social history 2) The pt's use of alcohol, tobacco, and illicit drugs 3) The patient's current medications and supplements 4) Functional ability including ADL's, fall risk, home safety risk, hearing and visual impairment 5) Diet and physical activities 6) Evidence for depression or mood disorder 7) The patient's height, weight, and BMI have been recorded in the chart  I have made referrals, and provided counseling and education based on review of the above       Other Visit Diagnoses    Cervical cancer screening    -  Primary   Relevant Orders   Cytology - PAP   Fatigue, unspecified  type       Relevant Orders   Comprehensive metabolic panel   TSH   Moderate mixed hyperlipidemia not requiring statin therapy       Relevant Orders   Lipid panel      I have discontinued Calia B. Valko's cyclobenzaprine, meloxicam, metroNIDAZOLE, fluconazole, and sulfamethoxazole-trimethoprim. I am also having her maintain her Diclofenac Sodium, acyclovir, and furosemide.  No orders of the defined types were placed in this encounter.   Medications Discontinued During This Encounter  Medication Reason  . acyclovir (ZOVIRAX) 400 MG tablet   . cyclobenzaprine (FLEXERIL) 10 MG tablet   . fluconazole (DIFLUCAN) 150 MG tablet   . meloxicam (MOBIC) 15 MG tablet   . metroNIDAZOLE (FLAGYL) 500 MG tablet   . sulfamethoxazole-trimethoprim (BACTRIM DS) 800-160 MG tablet     Follow-up: No follow-ups on file.   Sherlene Shams, MD

## 2020-07-06 LAB — COMPREHENSIVE METABOLIC PANEL
ALT: 12 U/L (ref 0–35)
AST: 20 U/L (ref 0–37)
Albumin: 4.2 g/dL (ref 3.5–5.2)
Alkaline Phosphatase: 35 U/L — ABNORMAL LOW (ref 39–117)
BUN: 21 mg/dL (ref 6–23)
CO2: 28 mEq/L (ref 19–32)
Calcium: 9.3 mg/dL (ref 8.4–10.5)
Chloride: 101 mEq/L (ref 96–112)
Creatinine, Ser: 1.16 mg/dL (ref 0.40–1.20)
GFR: 54.11 mL/min — ABNORMAL LOW (ref >60.00)
Glucose, Bld: 85 mg/dL (ref 70–99)
Potassium: 4 mEq/L (ref 3.5–5.1)
Sodium: 137 mEq/L (ref 135–145)
Total Bilirubin: 0.4 mg/dL (ref 0.2–1.2)
Total Protein: 7.1 g/dL (ref 6.0–8.3)

## 2020-07-06 LAB — LIPID PANEL
Cholesterol: 219 mg/dL — ABNORMAL HIGH (ref 0–200)
HDL: 87.4 mg/dL (ref >39.00)
LDL Cholesterol: 119 mg/dL — ABNORMAL HIGH (ref 0–99)
NonHDL: 131.97
Total CHOL/HDL Ratio: 3
Triglycerides: 67 mg/dL (ref 0.0–149.0)
VLDL: 13.4 mg/dL (ref 0.0–40.0)

## 2020-07-06 LAB — TSH: TSH: 1.13 u[IU]/mL (ref 0.35–4.50)

## 2020-07-06 NOTE — Progress Notes (Signed)
Your  labs are normal, except for a slight drop in your GFR (kidney filtration rate)  which could have been due to your fasting state causing mild dehydration  .  Please make sure you are drinking at least 80 ounces of water daily (enough to make your urine clear in color) and  we can repeat the test in a non fasting state in one month   Regards,   Duncan Dull, MD

## 2020-07-09 DIAGNOSIS — B3731 Acute candidiasis of vulva and vagina: Secondary | ICD-10-CM | POA: Insufficient documentation

## 2020-07-09 DIAGNOSIS — B373 Candidiasis of vulva and vagina: Secondary | ICD-10-CM | POA: Insufficient documentation

## 2020-07-09 LAB — CYTOLOGY - PAP
Comment: NEGATIVE
Diagnosis: NEGATIVE
High risk HPV: NEGATIVE

## 2020-07-09 MED ORDER — FLUCONAZOLE 150 MG PO TABS: 150.0000 mg | ORAL_TABLET | Freq: Every day | ORAL | 0 refills | Status: DC

## 2020-07-09 NOTE — Addendum Note (Signed)
Addended by: Sherlene Shams on: 07/09/2020 05:33 PM   Modules accepted: Orders

## 2020-07-25 ENCOUNTER — Other Ambulatory Visit: Payer: Self-pay | Admitting: Internal Medicine

## 2020-07-25 MED ORDER — CHERATUSSIN AC 100-10 MG/5ML PO SOLN: 10.0000 mL | Freq: Two times a day (BID) | ORAL | 0 refills | Status: DC | PRN

## 2020-09-24 ENCOUNTER — Other Ambulatory Visit: Payer: Self-pay | Admitting: Internal Medicine

## 2021-01-10 ENCOUNTER — Other Ambulatory Visit: Payer: Self-pay | Admitting: Internal Medicine

## 2021-01-10 MED ORDER — FLUCONAZOLE 150 MG PO TABS: 150.0000 mg | ORAL_TABLET | Freq: Every day | ORAL | 0 refills | Status: DC

## 2021-01-10 MED ORDER — FUROSEMIDE 20 MG PO TABS: 20.0000 mg | ORAL_TABLET | Freq: Every day | ORAL | 1 refills | Status: DC

## 2021-02-27 ENCOUNTER — Other Ambulatory Visit: Payer: Self-pay | Admitting: Internal Medicine

## 2021-02-27 MED ORDER — NORETHIN ACE-ETH ESTRAD-FE 1-20 MG-MCG(24) PO TABS: 1.0000 | ORAL_TABLET | Freq: Every day | ORAL | 11 refills | Status: DC

## 2021-07-08 ENCOUNTER — Encounter: Payer: Self-pay | Admitting: Internal Medicine

## 2021-11-29 ENCOUNTER — Other Ambulatory Visit: Payer: Self-pay | Admitting: Internal Medicine

## 2021-11-29 NOTE — Telephone Encounter (Signed)
Refilled: 01/10/2021 Last OV: 07/05/2020 Next OV: not scheduled

## 2022-02-26 ENCOUNTER — Other Ambulatory Visit: Payer: Self-pay | Admitting: Internal Medicine

## 2022-02-26 DIAGNOSIS — R609 Edema, unspecified: Secondary | ICD-10-CM

## 2022-02-26 NOTE — Telephone Encounter (Signed)
Refilled: 11/29/2021 Last OV: 07/05/2020 Next OV: not scheduled

## 2022-02-27 NOTE — Telephone Encounter (Signed)
Courtesy refill.  Has not had renal function in over a year.  Cmet ordered. Please call to schedule.  Can change to [facility] if need to

## 2022-02-27 NOTE — Telephone Encounter (Signed)
LMTCB. Need to schedule pt for a nonfasting lab appt.  

## 2022-05-27 ENCOUNTER — Other Ambulatory Visit: Payer: Self-pay | Admitting: Internal Medicine

## 2022-05-27 DIAGNOSIS — R609 Edema, unspecified: Secondary | ICD-10-CM

## 2022-07-16 ENCOUNTER — Other Ambulatory Visit: Payer: Self-pay | Admitting: Internal Medicine

## 2022-07-16 MED ORDER — TRIAMCINOLONE ACETONIDE 0.1 % EX CREA: 1.0000 | TOPICAL_CREAM | Freq: Two times a day (BID) | CUTANEOUS | 3 refills | Status: AC

## 2022-08-28 ENCOUNTER — Other Ambulatory Visit: Payer: Self-pay | Admitting: Internal Medicine

## 2022-08-28 DIAGNOSIS — R609 Edema, unspecified: Secondary | ICD-10-CM

## 2022-09-10 ENCOUNTER — Telehealth: Payer: Self-pay | Admitting: Internal Medicine

## 2022-09-10 ENCOUNTER — Other Ambulatory Visit: Payer: Self-pay | Admitting: Internal Medicine

## 2022-09-10 DIAGNOSIS — E782 Mixed hyperlipidemia: Secondary | ICD-10-CM

## 2022-09-10 DIAGNOSIS — R5383 Other fatigue: Secondary | ICD-10-CM

## 2022-09-10 NOTE — Telephone Encounter (Signed)
PLEASE SCHEDULE MY FRIEND Alice Vaughn FOR AN ANNUAL WITH PAP SMEAR.

## 2022-09-10 NOTE — Telephone Encounter (Signed)
Lm to call office to schedule a physical w/pap.

## 2022-09-17 NOTE — Telephone Encounter (Signed)
LMTCB

## 2022-09-22 NOTE — Telephone Encounter (Signed)
Left voicemail to call back for appt.

## 2022-09-24 NOTE — Telephone Encounter (Signed)
3 attempts have been made. Pt has not returned call or scheduled appt.

## 2022-09-26 NOTE — Telephone Encounter (Signed)
Appt scheduled

## 2022-11-25 ENCOUNTER — Other Ambulatory Visit: Payer: Self-pay | Admitting: Internal Medicine

## 2022-11-25 DIAGNOSIS — R609 Edema, unspecified: Secondary | ICD-10-CM

## 2022-11-25 NOTE — Telephone Encounter (Signed)
Has not been seen since 2022. I scheduled her for an appt in August but it was rescheduled to October.

## 2022-11-26 ENCOUNTER — Encounter: Payer: Self-pay | Admitting: Internal Medicine

## 2022-11-26 ENCOUNTER — Ambulatory Visit (INDEPENDENT_AMBULATORY_CARE_PROVIDER_SITE_OTHER): Payer: Medicaid Other | Admitting: Internal Medicine

## 2022-11-26 VITALS — BP 104/72 | HR 80 | Temp 98.5°F | Ht 63.5 in | Wt 145.0 lb

## 2022-11-26 DIAGNOSIS — E782 Mixed hyperlipidemia: Secondary | ICD-10-CM | POA: Diagnosis not present

## 2022-11-26 DIAGNOSIS — R5383 Other fatigue: Secondary | ICD-10-CM | POA: Diagnosis not present

## 2022-11-26 DIAGNOSIS — D500 Iron deficiency anemia secondary to blood loss (chronic): Secondary | ICD-10-CM | POA: Diagnosis not present

## 2022-11-26 DIAGNOSIS — Z1231 Encounter for screening mammogram for malignant neoplasm of breast: Secondary | ICD-10-CM

## 2022-11-26 DIAGNOSIS — R609 Edema, unspecified: Secondary | ICD-10-CM

## 2022-11-26 DIAGNOSIS — R944 Abnormal results of kidney function studies: Secondary | ICD-10-CM

## 2022-11-26 DIAGNOSIS — Z0000 Encounter for general adult medical examination without abnormal findings: Secondary | ICD-10-CM

## 2022-11-26 DIAGNOSIS — Z1211 Encounter for screening for malignant neoplasm of colon: Secondary | ICD-10-CM

## 2022-11-26 DIAGNOSIS — R7301 Impaired fasting glucose: Secondary | ICD-10-CM | POA: Diagnosis not present

## 2022-11-26 LAB — CBC WITH DIFFERENTIAL/PLATELET
Basophils Absolute: 0 10*3/uL (ref 0.0–0.1)
Basophils Relative: 0.3 % (ref 0.0–3.0)
Eosinophils Absolute: 0 10*3/uL (ref 0.0–0.7)
Eosinophils Relative: 0.4 % (ref 0.0–5.0)
HCT: 36.6 % (ref 36.0–46.0)
Hemoglobin: 11.8 g/dL — ABNORMAL LOW (ref 12.0–15.0)
Lymphocytes Relative: 23.4 % (ref 12.0–46.0)
Lymphs Abs: 1.4 10*3/uL (ref 0.7–4.0)
MCHC: 32.2 g/dL (ref 30.0–36.0)
MCV: 88.7 fL (ref 78.0–100.0)
Monocytes Absolute: 0.4 10*3/uL (ref 0.1–1.0)
Monocytes Relative: 6.4 % (ref 3.0–12.0)
Neutro Abs: 4 10*3/uL (ref 1.4–7.7)
Neutrophils Relative %: 69.5 % (ref 43.0–77.0)
Platelets: 219 10*3/uL (ref 150.0–400.0)
RBC: 4.13 Mil/uL (ref 3.87–5.11)
RDW: 14.3 % (ref 11.5–15.5)
WBC: 5.8 10*3/uL (ref 4.0–10.5)

## 2022-11-26 LAB — COMPREHENSIVE METABOLIC PANEL
ALT: 11 U/L (ref 0–35)
AST: 18 U/L (ref 0–37)
Albumin: 4.1 g/dL (ref 3.5–5.2)
Alkaline Phosphatase: 33 U/L — ABNORMAL LOW (ref 39–117)
BUN: 17 mg/dL (ref 6–23)
CO2: 29 meq/L (ref 19–32)
Calcium: 9.5 mg/dL (ref 8.4–10.5)
Chloride: 101 meq/L (ref 96–112)
Creatinine, Ser: 1.16 mg/dL (ref 0.40–1.20)
GFR: 53.21 mL/min — ABNORMAL LOW (ref >60.00)
Glucose, Bld: 87 mg/dL (ref 70–99)
Potassium: 3.6 meq/L (ref 3.5–5.1)
Sodium: 136 meq/L (ref 135–145)
Total Bilirubin: 0.6 mg/dL (ref 0.2–1.2)
Total Protein: 7.2 g/dL (ref 6.0–8.3)

## 2022-11-26 LAB — B12 AND FOLATE PANEL
Folate: 10.4 ng/mL (ref >5.9)
Vitamin B-12: 1317 pg/mL — ABNORMAL HIGH (ref 211–911)

## 2022-11-26 LAB — TSH: TSH: 1.85 u[IU]/mL (ref 0.35–5.50)

## 2022-11-26 LAB — HEMOGLOBIN A1C: Hgb A1c MFr Bld: 5.4 % (ref 4.6–6.5)

## 2022-11-26 MED ORDER — TETANUS-DIPHTH-ACELL PERTUSSIS 5-2.5-18.5 LF-MCG/0.5 IM SUSY: 0.5000 mL | PREFILLED_SYRINGE | Freq: Once | INTRAMUSCULAR | 0 refills | Status: AC

## 2022-11-26 NOTE — Assessment & Plan Note (Signed)
Lower extremity,  mild and chronic.  Continue low dose diuretic

## 2022-11-26 NOTE — Patient Instructions (Addendum)
Your annual mammogram has been ordered .  Alice Vaughn will not allow [place] to schedule it for you,  so please  call to make your appointment [phone]   Labs today   I will initiate the order for your colon cancer screening  Test, the one called  Cologuard.  It will be delivered to your house, and you will send off a stool sample in the envelope it provides.   Check with your insurance to make sure it is covered as a screening test   Tetanus shot  needs updating but has to be done at your pharmacy (script provided)

## 2022-11-26 NOTE — Assessment & Plan Note (Signed)
History of iron deficiency in the past,  will check B12/folate/TSH/iron/CBC

## 2022-11-26 NOTE — Assessment & Plan Note (Signed)

## 2022-11-26 NOTE — Progress Notes (Signed)
Patient ID: Alice Vaughn, female    DOB: 1965-08-20  Age: 57 y.o. MRN: [id number]  The patient is here for annual preventive examination and management of other chronic and acute problems.   The risk factors are reflected in the social history.   The roster of all physicians providing medical care to patient - is listed in the Snapshot section of the chart.   Activities of daily living:  The patient is 100% independent in all ADLs: dressing, toileting, feeding as well as independent mobility   Home safety : The patient has smoke detectors in the home. They wear seatbelts.  There are no unsecured firearms at home. There is no violence in the home.    There is no risks for hepatitis, STDs or HIV. There is no   history of blood transfusion. They have no travel history to infectious disease endemic areas of the world.   The patient has seen their dentist in the last six month. They have seen their eye doctor in the last year. The patinet  denies slight hearing difficulty with regard to whispered voices and some television programs.  They have deferred audiologic testing in the last year.  They do not  have excessive sun exposure. Discussed the need for sun protection: hats, long sleeves and use of sunscreen if there is significant sun exposure.    Diet: the importance of a healthy diet is discussed. Alice Vaughn has a healthy diet.   The benefits of regular aerobic exercise were discussed. 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    Depression screen: there are no signs or vegative symptoms of depression- irritability, change in appetite, anhedonia, sadness/tearfullness.   The following portions of the patient's history were reviewed and updated as appropriate: allergies, current medications, past family history, past medical history,  past surgical history, past social history  and problem list.   Visual acuity was not assessed per patient  preference since the patient has regular follow up with an  ophthalmologist. Hearing and body mass index were assessed and reviewed.    During the course of the visit the patient was educated and counseled about appropriate screening and preventive services including : fall prevention , diabetes screening, nutrition counseling, colorectal cancer screening, and recommended immunizations.    Chief Complaint:  Still menstruating monthly. Fatigue ,  trouble having restful sleep      Review of Symptoms  Patient denies headache, fevers, malaise, unintentional weight loss, skin rash, eye pain, sinus congestion and sinus pain, sore throat, dysphagia,  hemoptysis , cough, dyspnea, wheezing, chest pain, palpitations, orthopnea, edema, abdominal pain, nausea, melena, diarrhea, constipation, flank pain, dysuria, hematuria, urinary  Frequency, nocturia, numbness, tingling, seizures,  Focal weakness, Loss of consciousness,  Tremor, insomnia, depression, anxiety, and suicidal ideation.    Physical Exam:  BP 104/72   Pulse 80   Temp 98.5 F (36.9 C) (Oral)   Ht 5' 3.5" (1.613 m)   Wt 145 lb (65.8 kg)   SpO2 97%   BMI 25.28 kg/m    Physical Exam Vitals reviewed.  Constitutional:      General: Alice Vaughn is not in acute distress.    Appearance: Normal appearance. Alice Vaughn is well-developed and normal weight. Alice Vaughn is not ill-appearing, toxic-appearing or diaphoretic.  HENT:     Head: Normocephalic.     Right Ear: Tympanic membrane, ear canal and external ear normal. There is no impacted  cerumen.     Left Ear: Tympanic membrane, ear canal and external ear normal. There is no impacted cerumen.     Nose: Nose normal.     Mouth/Throat:     Mouth: Mucous membranes are moist.     Pharynx: Oropharynx is clear.  Eyes:     General: No scleral icterus.       Right eye: No discharge.        Left eye: No discharge.     Conjunctiva/sclera: Conjunctivae normal.     Pupils: Pupils are equal, round, and reactive to  light.  Neck:     Thyroid: No thyromegaly.     Vascular: No carotid bruit or JVD.  Cardiovascular:     Rate and Rhythm: Normal rate and regular rhythm.     Heart sounds: Normal heart sounds.  Pulmonary:     Effort: Pulmonary effort is normal. No respiratory distress.     Breath sounds: Normal breath sounds.  Chest:  Breasts:    Breasts are symmetrical.     Right: Normal. No swelling, inverted nipple, mass, nipple discharge, skin change or tenderness.     Left: Normal. No swelling, inverted nipple, mass, nipple discharge, skin change or tenderness.  Abdominal:     General: Bowel sounds are normal.     Palpations: Abdomen is soft. There is no mass.     Tenderness: There is no abdominal tenderness. There is no guarding or rebound.  Musculoskeletal:        General: Normal range of motion.     Cervical back: Normal range of motion and neck supple.  Lymphadenopathy:     Cervical: No cervical adenopathy.     Upper Body:     Right upper body: No supraclavicular, axillary or pectoral adenopathy.     Left upper body: No supraclavicular, axillary or pectoral adenopathy.  Skin:    General: Skin is warm and dry.  Neurological:     General: No focal deficit present.     Mental Status: Alice Vaughn is alert and oriented to person, place, and time. Mental status is at baseline.  Psychiatric:        Mood and Affect: Mood normal.        Behavior: Behavior normal.        Thought Content: Thought content normal.        Judgment: Judgment normal.    Assessment and Plan: Encounter for screening mammogram for malignant neoplasm of breast -     3D Screening Mammogram, Left and Right; Future  Iron deficiency anemia due to chronic blood loss -     Iron, TIBC and Ferritin Panel -     CBC with Differential/Platelet  Other fatigue Assessment & Plan: History of iron deficiency in the past,  will check B12/folate/TSH/iron/CBC  Orders: -     Comprehensive metabolic panel -     TSH -     B12 and Folate  Panel  Moderate mixed hyperlipidemia not requiring statin therapy -     Lipid Panel w/reflex Direct LDL  Colon cancer screening -     Cologuard  Impaired fasting glucose -     Hemoglobin A1c  Encounter for preventive health examination Assessment & Plan: age appropriate education and counseling updated, referrals for preventative services and immunizations addressed, dietary and smoking counseling addressed, most recent labs reviewed.  I have personally reviewed and have noted:   1) the patient's medical and social history 2) The pt's use of alcohol, tobacco, and illicit  drugs 3) The patient's current medications and supplements 4) Functional ability including ADL's, fall risk, home safety risk, hearing and visual impairment 5) Diet and physical activities 6) Evidence for depression or mood disorder 7) The patient's height, weight, and BMI have been recorded in the chart   I have made referrals, and provided counseling and education based on review of the above    Edema, unspecified type Assessment & Plan: Lower extremity,  mild and chronic.  Continue low dose diuretic    Other orders -     Tetanus-Diphth-Acell Pertussis; Inject 0.5 mLs into the muscle once for 1 dose.  Dispense: 0.5 mL; Refill: 0    No follow-ups on file.  Sherlene Shams, MD

## 2022-11-27 LAB — LIPID PANEL W/REFLEX DIRECT LDL
Cholesterol: 221 mg/dL — ABNORMAL HIGH (ref <200)
HDL: 80 mg/dL (ref >=50)
LDL Cholesterol (Calc): 126 mg/dL — ABNORMAL HIGH
Non-HDL Cholesterol (Calc): 141 mg/dL — ABNORMAL HIGH (ref <130)
Total CHOL/HDL Ratio: 2.8 (calc) (ref <5.0)
Triglycerides: 59 mg/dL (ref <150)

## 2022-11-27 LAB — IRON,TIBC AND FERRITIN PANEL
%SAT: 44 % (ref 16–45)
Ferritin: 70 ng/mL (ref 16–232)
Iron: 110 ug/dL (ref 45–160)
TIBC: 248 ug/dL — ABNORMAL LOW (ref 250–450)

## 2022-11-27 NOTE — Addendum Note (Signed)
Addended by: Sherlene Shams on: 11/27/2022 10:43 PM   Modules accepted: Orders

## 2022-12-05 ENCOUNTER — Other Ambulatory Visit (INDEPENDENT_AMBULATORY_CARE_PROVIDER_SITE_OTHER): Payer: Medicaid Other

## 2022-12-05 DIAGNOSIS — R944 Abnormal results of kidney function studies: Secondary | ICD-10-CM | POA: Diagnosis not present

## 2022-12-05 LAB — BASIC METABOLIC PANEL
BUN: 17 mg/dL (ref 6–23)
CO2: 32 meq/L (ref 19–32)
Calcium: 9.5 mg/dL (ref 8.4–10.5)
Chloride: 101 meq/L (ref 96–112)
Creatinine, Ser: 1 mg/dL (ref 0.40–1.20)
GFR: 63.57 mL/min (ref >60.00)
Glucose, Bld: 80 mg/dL (ref 70–99)
Potassium: 3.9 meq/L (ref 3.5–5.1)
Sodium: 139 meq/L (ref 135–145)

## 2022-12-08 ENCOUNTER — Telehealth: Payer: Self-pay

## 2022-12-08 NOTE — Telephone Encounter (Signed)
LVM for pt to CB in regards to labs:   Sherlene Shams, MD  Sandy Salaam, CMA Your kidney function is  BACK TO BASELINE.  The furosemide was definitely dehydrating you. I would reduce your use to "as needed "  or every other day.   Regards,   Duncan Dull, MD

## 2022-12-19 LAB — COLOGUARD: COLOGUARD: NEGATIVE

## 2023-06-13 DIAGNOSIS — H52223 Regular astigmatism, bilateral: Secondary | ICD-10-CM | POA: Diagnosis not present

## 2023-08-21 ENCOUNTER — Other Ambulatory Visit: Payer: Self-pay | Admitting: Internal Medicine

## 2023-08-21 DIAGNOSIS — R609 Edema, unspecified: Secondary | ICD-10-CM

## 2023-11-13 ENCOUNTER — Other Ambulatory Visit: Payer: Self-pay | Admitting: Internal Medicine

## 2023-11-13 DIAGNOSIS — R609 Edema, unspecified: Secondary | ICD-10-CM

## 2023-11-13 NOTE — Telephone Encounter (Signed)
 LVM for pt to called office back to schedule an appointment for fasting labs and annual physical. Pt last labs and office visit 11/26/2022

## 2023-11-17 NOTE — Telephone Encounter (Signed)
 LMTCB

## 2023-11-19 ENCOUNTER — Telehealth: Payer: Self-pay

## 2023-11-19 ENCOUNTER — Other Ambulatory Visit: Payer: Self-pay | Admitting: Internal Medicine

## 2023-11-19 DIAGNOSIS — R609 Edema, unspecified: Secondary | ICD-10-CM

## 2023-11-19 MED ORDER — FUROSEMIDE 20 MG PO TABS: 20.0000 mg | ORAL_TABLET | Freq: Every day | ORAL | 0 refills | Status: DC

## 2023-11-19 NOTE — Telephone Encounter (Signed)
 3rd attempt made. Voicemail left.  Please schedule pt when call is returned

## 2023-11-19 NOTE — Telephone Encounter (Signed)
 Copied from CRM [id number]. Topic: Clinical - Medication Question >> Nov 19, 2023 11:07 AM Chiquita SQUIBB wrote: Reason for CRM: Patient is asking if her furosemide  (LASIX ) 20 MG tablet can be refilled until her next appointment, patient is scheduled for the first available and on the wait list as well. Please advise the patient.

## 2024-02-05 ENCOUNTER — Ambulatory Visit: Admitting: Internal Medicine

## 2024-02-05 ENCOUNTER — Encounter: Payer: Self-pay | Admitting: Internal Medicine

## 2024-02-05 ENCOUNTER — Other Ambulatory Visit (HOSPITAL_COMMUNITY)
Admission: RE | Admit: 2024-02-05 | Discharge: 2024-02-05 | Disposition: A | Discharge status: Home / Self Care | Source: Ambulatory Visit | Attending: Internal Medicine | Admitting: Internal Medicine

## 2024-02-05 VITALS — BP 100/72 | HR 66 | Ht 63.5 in | Wt 147.0 lb

## 2024-02-05 DIAGNOSIS — Z0000 Encounter for general adult medical examination without abnormal findings: Secondary | ICD-10-CM

## 2024-02-05 DIAGNOSIS — R5383 Other fatigue: Secondary | ICD-10-CM | POA: Diagnosis not present

## 2024-02-05 DIAGNOSIS — R7301 Impaired fasting glucose: Secondary | ICD-10-CM | POA: Diagnosis not present

## 2024-02-05 DIAGNOSIS — E782 Mixed hyperlipidemia: Secondary | ICD-10-CM

## 2024-02-05 DIAGNOSIS — Z124 Encounter for screening for malignant neoplasm of cervix: Secondary | ICD-10-CM | POA: Diagnosis not present

## 2024-02-05 DIAGNOSIS — N912 Amenorrhea, unspecified: Secondary | ICD-10-CM

## 2024-02-05 DIAGNOSIS — R944 Abnormal results of kidney function studies: Secondary | ICD-10-CM

## 2024-02-05 DIAGNOSIS — N8111 Cystocele, midline: Secondary | ICD-10-CM

## 2024-02-05 DIAGNOSIS — Z23 Encounter for immunization: Secondary | ICD-10-CM

## 2024-02-05 DIAGNOSIS — Z1231 Encounter for screening mammogram for malignant neoplasm of breast: Secondary | ICD-10-CM

## 2024-02-05 LAB — CBC WITH DIFFERENTIAL/PLATELET
Basophils Absolute: 0 K/uL (ref 0.0–0.1)
Basophils Relative: 0.2 % (ref 0.0–3.0)
Eosinophils Absolute: 0 K/uL (ref 0.0–0.7)
Eosinophils Relative: 0.3 % (ref 0.0–5.0)
HCT: 37.3 % (ref 36.0–46.0)
Hemoglobin: 12.2 g/dL (ref 12.0–15.0)
Lymphocytes Relative: 31.6 % (ref 12.0–46.0)
Lymphs Abs: 1.7 K/uL (ref 0.7–4.0)
MCHC: 32.8 g/dL (ref 30.0–36.0)
MCV: 88 fl (ref 78.0–100.0)
Monocytes Absolute: 0.4 K/uL (ref 0.1–1.0)
Monocytes Relative: 7.8 % (ref 3.0–12.0)
Neutro Abs: 3.2 K/uL (ref 1.4–7.7)
Neutrophils Relative %: 60.1 % (ref 43.0–77.0)
Platelets: 203 K/uL (ref 150.0–400.0)
RBC: 4.24 Mil/uL (ref 3.87–5.11)
RDW: 14 % (ref 11.5–15.5)
WBC: 5.2 K/uL (ref 4.0–10.5)

## 2024-02-05 LAB — LIPID PANEL
Cholesterol: 230 mg/dL — ABNORMAL HIGH (ref 0–200)
HDL: 77.8 mg/dL (ref >39.00)
LDL Cholesterol: 143 mg/dL — ABNORMAL HIGH (ref 0–99)
NonHDL: 152.52
Total CHOL/HDL Ratio: 3
Triglycerides: 48 mg/dL (ref 0.0–149.0)
VLDL: 9.6 mg/dL (ref 0.0–40.0)

## 2024-02-05 LAB — COMPREHENSIVE METABOLIC PANEL WITH GFR
ALT: 16 U/L (ref 0–35)
AST: 22 U/L (ref 0–37)
Albumin: 4.5 g/dL (ref 3.5–5.2)
Alkaline Phosphatase: 40 U/L (ref 39–117)
BUN: 22 mg/dL (ref 6–23)
CO2: 34 meq/L — ABNORMAL HIGH (ref 19–32)
Calcium: 9.6 mg/dL (ref 8.4–10.5)
Chloride: 99 meq/L (ref 96–112)
Creatinine, Ser: 1.05 mg/dL (ref 0.40–1.20)
GFR: 59.46 mL/min — ABNORMAL LOW (ref >60.00)
Glucose, Bld: 81 mg/dL (ref 70–99)
Potassium: 4.3 meq/L (ref 3.5–5.1)
Sodium: 139 meq/L (ref 135–145)
Total Bilirubin: 0.5 mg/dL (ref 0.2–1.2)
Total Protein: 7.3 g/dL (ref 6.0–8.3)

## 2024-02-05 LAB — TSH: TSH: 1.85 u[IU]/mL (ref 0.35–5.50)

## 2024-02-05 LAB — LDL CHOLESTEROL, DIRECT: Direct LDL: 132 mg/dL

## 2024-02-05 LAB — HEMOGLOBIN A1C: Hgb A1c MFr Bld: 5.6 % (ref 4.6–6.5)

## 2024-02-05 NOTE — Progress Notes (Signed)
 Patient ID: Alice Vaughn, female    DOB: [birth year]  Age: 58 y.o. MRN: [id number]  The patient is here for annual preventive examination and management of other chronic and acute problems.   The risk factors are reflected in the social history.   The roster of all physicians providing medical care to patient - is listed in the Snapshot section of the chart.   Activities of daily living:  The patient is 100% independent in all ADLs: dressing, toileting, feeding as well as independent mobility   Home safety : The patient has smoke detectors in the home. They wear seatbelts.  There are no unsecured firearms at home. There is no violence in the home.    There is no risks for hepatitis, STDs or HIV. There is no   history of blood transfusion. They have no travel history to infectious disease endemic areas of the world.   The patient has seen their dentist in the last six month. They have seen their eye doctor in the last year. The patinet  denies slight hearing difficulty with regard to whispered voices and some television programs.  They have deferred audiologic testing in the last year.  They do not  have excessive sun exposure. Discussed the need for sun protection: hats, long sleeves and use of sunscreen if there is significant sun exposure.    Diet: the importance of a healthy diet is discussed. She has  a healthy diet.   The benefits of regular aerobic exercise were discussed. The patient  exercises  vigorously 6 days  per week  for  60 minutes.    Depression screen: there are no signs or vegative symptoms of depression- irritability, change in appetite, anhedonia, sadness/tearfullness.   The following portions of the patient's history were reviewed and updated as appropriate: allergies, current medications, past family history, past medical history,  past surgical history, past social history  and problem list.   Visual acuity was not assessed per patient preference since the patient has  regular follow up with an  ophthalmologist. Hearing and body mass index were assessed and reviewed.    During the course of the visit the patient was educated and counseled about appropriate screening and preventive services including : fall prevention , diabetes screening, nutrition counseling, colorectal cancer screening, and recommended immunizations.    Chief Complaint:    1) occasional insomnia    2) last full menses was in   July,  has only spotted since then .    Low back pain intermiittent .  Hot flashes  have become more frequent,  no vaginal dryness  3) dry mouth, sinus congestion due to allergic rhinitis   Review of Symptoms  Patient denies headache, fevers, malaise, unintentional weight loss, skin rash, eye pain, sinus congestion and sinus pain, sore throat, dysphagia,  hemoptysis , cough, dyspnea, wheezing, chest pain, palpitations, orthopnea, edema, abdominal pain, nausea, melena, diarrhea, constipation, flank pain, dysuria, hematuria, urinary  Frequency, nocturia, numbness, tingling, seizures,  Focal weakness, Loss of consciousness,  Tremor, insomnia, depression, anxiety, and suicidal ideation.    Physical Exam:  BP 100/72   Pulse 66   Ht 5' 3.5 (1.613 m)   Wt 147 lb (66.7 kg)   SpO2 99%   BMI 25.63 kg/m    Physical Exam Vitals reviewed.  Constitutional:      General: She is not in acute distress.    Appearance: Normal appearance. She is well-developed and normal weight. She is not ill-appearing, toxic-appearing or  diaphoretic.  HENT:     Head: Normocephalic.     Right Ear: Tympanic membrane, ear canal and external ear normal. There is no impacted cerumen.     Left Ear: Tympanic membrane, ear canal and external ear normal. There is no impacted cerumen.     Nose: Nose normal.     Mouth/Throat:     Mouth: Mucous membranes are moist.     Pharynx: Oropharynx is clear.  Eyes:     General: No scleral icterus.       Right eye: No discharge.        Left eye: No  discharge.     Conjunctiva/sclera: Conjunctivae normal.     Pupils: Pupils are equal, round, and reactive to light.  Neck:     Thyroid: No thyromegaly.     Vascular: No carotid bruit or JVD.  Cardiovascular:     Rate and Rhythm: Normal rate and regular rhythm.     Heart sounds: Normal heart sounds.  Pulmonary:     Effort: Pulmonary effort is normal. No respiratory distress.     Breath sounds: Normal breath sounds.  Chest:  Breasts:    Breasts are symmetrical.     Right: Normal. No swelling, inverted nipple, mass, nipple discharge, skin change or tenderness.     Left: Normal. No swelling, inverted nipple, mass, nipple discharge, skin change or tenderness.  Abdominal:     General: Bowel sounds are normal.     Palpations: Abdomen is soft. There is no mass.     Tenderness: There is no abdominal tenderness. There is no guarding or rebound.     Hernia: There is no hernia in the left inguinal area or right inguinal area.  Genitourinary:    General: Normal vulva.     Exam position: Lithotomy position.     Pubic Area: No rash or pubic lice.      Labia:        Right: No rash, tenderness, lesion or injury.        Left: No rash, tenderness, lesion or injury.      Urethra: Prolapse present.     Vagina: Normal.     Cervix: Normal.     Uterus: Normal.      Adnexa: Right adnexa normal and left adnexa normal.     Comments: cystocele Musculoskeletal:        General: Normal range of motion.     Cervical back: Normal range of motion and neck supple.  Lymphadenopathy:     Cervical: No cervical adenopathy.     Upper Body:     Right upper body: No supraclavicular, axillary or pectoral adenopathy.     Left upper body: No supraclavicular, axillary or pectoral adenopathy.     Lower Body: No right inguinal adenopathy. No left inguinal adenopathy.  Skin:    General: Skin is warm and dry.  Neurological:     General: No focal deficit present.     Mental Status: She is alert and oriented to person,  place, and time. Mental status is at baseline.  Psychiatric:        Mood and Affect: Mood normal.        Behavior: Behavior normal.        Thought Content: Thought content normal.        Judgment: Judgment normal.     Assessment and Plan: Cervical cancer screening -     Cytology - PAP  Encounter for screening mammogram for malignant neoplasm of breast -  3D Screening Mammogram, Left and Right; Future  Encounter for preventive health examination Assessment & Plan: age appropriate education and counseling updated, referrals for preventative services and immunizations addressed, dietary and smoking counseling addressed, most recent labs reviewed.  I have personally reviewed and have noted:   1) the patient's medical and social history 2) The pt's use of alcohol, tobacco, and illicit drugs 3) The patient's current medications and supplements 4) Functional ability including ADL's, fall risk, home safety risk, hearing and visual impairment 5) Diet and physical activities 6) Evidence for depression or mood disorder 7) The patient's height, weight, and BMI have been recorded in the chart   I have made referrals, and provided counseling and education based on review of the above    Other fatigue -     CBC with Differential/Platelet -     TSH  Moderate mixed hyperlipidemia not requiring statin therapy -     Lipid panel -     LDL cholesterol, direct  Impaired fasting glucose -     Comprehensive metabolic panel with GFR -     Hemoglobin A1c  Amenorrhea -     FSH/LH; Future  Need for influenza vaccination -     Flu vaccine trivalent PF, 6mos and older(Flulaval,Afluria,Fluarix,Fluzone)  Midline cystocele -     Ambulatory referral to Urogynecology  Decreased GFR Assessment & Plan: Will suspend spironolactone and return after hydration   Orders: -     Basic metabolic panel with GFR; Future    No follow-ups on file.  Verneita LITTIE Kettering, MD

## 2024-02-05 NOTE — Patient Instructions (Addendum)
 Your bladder has dropped .  This is called a cystocele  I'm making a referral to [facility] Urogynecology for evaluation and treatment options   YOUR MAMMOGRAM Ihas been ordered , PLEASE CALL AND GET THIS SCHEDULED  at  [facility] - call [phone]  Veronda does  the scheduling for [facility] as well

## 2024-02-07 ENCOUNTER — Ambulatory Visit: Payer: Self-pay | Admitting: Internal Medicine

## 2024-02-07 DIAGNOSIS — R944 Abnormal results of kidney function studies: Secondary | ICD-10-CM | POA: Insufficient documentation

## 2024-02-07 NOTE — Assessment & Plan Note (Signed)
 Will suspend spironolactone and return after hydration

## 2024-02-07 NOTE — Assessment & Plan Note (Signed)

## 2024-02-08 LAB — CYTOLOGY - PAP
Adequacy: ABSENT
Comment: NEGATIVE
Diagnosis: NEGATIVE
High risk HPV: NEGATIVE

## 2024-02-10 ENCOUNTER — Encounter: Payer: Self-pay | Admitting: Internal Medicine

## 2024-02-11 ENCOUNTER — Other Ambulatory Visit (INDEPENDENT_AMBULATORY_CARE_PROVIDER_SITE_OTHER)

## 2024-02-11 DIAGNOSIS — R944 Abnormal results of kidney function studies: Secondary | ICD-10-CM | POA: Diagnosis not present

## 2024-02-11 DIAGNOSIS — N912 Amenorrhea, unspecified: Secondary | ICD-10-CM

## 2024-02-12 ENCOUNTER — Encounter: Payer: Self-pay | Admitting: Internal Medicine

## 2024-02-12 LAB — FSH/LH
FSH: 55.2 m[IU]/mL
LH: 55.5 m[IU]/mL

## 2024-02-12 LAB — BASIC METABOLIC PANEL WITH GFR
BUN: 30 mg/dL — ABNORMAL HIGH (ref 6–23)
CO2: 31 meq/L (ref 19–32)
Calcium: 9.7 mg/dL (ref 8.4–10.5)
Chloride: 98 meq/L (ref 96–112)
Creatinine, Ser: 1.25 mg/dL — ABNORMAL HIGH (ref 0.40–1.20)
GFR: 48.23 mL/min — ABNORMAL LOW
Glucose, Bld: 78 mg/dL (ref 70–99)
Potassium: 4.7 meq/L (ref 3.5–5.1)
Sodium: 137 meq/L (ref 135–145)

## 2024-02-13 ENCOUNTER — Other Ambulatory Visit: Payer: Self-pay | Admitting: Internal Medicine

## 2024-02-13 DIAGNOSIS — R944 Abnormal results of kidney function studies: Secondary | ICD-10-CM

## 2025-02-06 ENCOUNTER — Encounter: Admitting: Internal Medicine
